# Patient Record
Sex: Female | Born: 1985 | Race: Black or African American | Hispanic: No | Marital: Married | Smoking: Never smoker
Health system: Southern US, Community
[De-identification: ages and names within clinical notes are randomized; demographics above are authoritative.]

## PROBLEM LIST (undated history)

## (undated) ENCOUNTER — Emergency Department (HOSPITAL_COMMUNITY): Source: Home / Self Care

## (undated) ENCOUNTER — Emergency Department (HOSPITAL_COMMUNITY)

## (undated) ENCOUNTER — Inpatient Hospital Stay (HOSPITAL_COMMUNITY): Payer: Self-pay

## (undated) DIAGNOSIS — E669 Obesity, unspecified: Secondary | ICD-10-CM

## (undated) DIAGNOSIS — O139 Gestational [pregnancy-induced] hypertension without significant proteinuria, unspecified trimester: Secondary | ICD-10-CM

## (undated) HISTORY — PX: TONSILLECTOMY: SUR1361

---

## 2004-04-16 ENCOUNTER — Ambulatory Visit: Payer: Self-pay | Admitting: Nurse Practitioner

## 2006-10-12 ENCOUNTER — Emergency Department (HOSPITAL_COMMUNITY): Admission: EM | Admit: 2006-10-12 | Discharge: 2006-10-12 | Payer: Self-pay | Admitting: Emergency Medicine

## 2007-07-05 ENCOUNTER — Ambulatory Visit: Payer: Self-pay | Admitting: Internal Medicine

## 2007-11-05 ENCOUNTER — Emergency Department (HOSPITAL_COMMUNITY): Admission: EM | Admit: 2007-11-05 | Discharge: 2007-11-06 | Payer: Self-pay | Admitting: Emergency Medicine

## 2008-02-27 ENCOUNTER — Emergency Department (HOSPITAL_COMMUNITY): Admission: EM | Admit: 2008-02-27 | Discharge: 2008-02-27 | Payer: Self-pay | Admitting: Emergency Medicine

## 2008-03-20 ENCOUNTER — Emergency Department (HOSPITAL_COMMUNITY): Admission: EM | Admit: 2008-03-20 | Discharge: 2008-03-20 | Payer: Self-pay | Admitting: Emergency Medicine

## 2008-08-18 ENCOUNTER — Emergency Department (HOSPITAL_COMMUNITY): Admission: EM | Admit: 2008-08-18 | Discharge: 2008-08-18 | Payer: Self-pay | Admitting: Emergency Medicine

## 2010-05-16 LAB — COMPREHENSIVE METABOLIC PANEL
BUN: 10 mg/dL (ref 6–23)
Calcium: 9.3 mg/dL (ref 8.4–10.5)
Creatinine, Ser: 0.81 mg/dL (ref 0.4–1.2)
Glucose, Bld: 111 mg/dL — ABNORMAL HIGH (ref 70–99)
Sodium: 140 mEq/L (ref 135–145)
Total Protein: 7.1 g/dL (ref 6.0–8.3)

## 2010-05-16 LAB — URINALYSIS, ROUTINE W REFLEX MICROSCOPIC
Glucose, UA: NEGATIVE mg/dL
Protein, ur: 30 mg/dL — AB

## 2010-05-16 LAB — CBC
HCT: 35.2 % — ABNORMAL LOW (ref 36.0–46.0)
Hemoglobin: 11.8 g/dL — ABNORMAL LOW (ref 12.0–15.0)
MCHC: 33.6 g/dL (ref 30.0–36.0)
RDW: 14.6 % (ref 11.5–15.5)

## 2010-05-16 LAB — URINE MICROSCOPIC-ADD ON

## 2010-05-16 LAB — LIPASE, BLOOD: Lipase: 27 U/L (ref 11–59)

## 2010-05-24 LAB — URINE CULTURE: Colony Count: >=100000

## 2010-05-24 LAB — URINALYSIS, ROUTINE W REFLEX MICROSCOPIC
Nitrite: NEGATIVE
Specific Gravity, Urine: 1.032 — ABNORMAL HIGH (ref 1.005–1.030)
Urobilinogen, UA: 0.2 mg/dL (ref 0.0–1.0)

## 2010-05-24 LAB — URINE MICROSCOPIC-ADD ON

## 2010-05-24 LAB — POCT PREGNANCY, URINE: Preg Test, Ur: NEGATIVE

## 2010-05-25 LAB — COMPREHENSIVE METABOLIC PANEL
ALT: 17 U/L (ref 0–35)
AST: 17 U/L (ref 0–37)
Alkaline Phosphatase: 80 U/L (ref 39–117)
CO2: 26 mEq/L (ref 19–32)
GFR calc Af Amer: >60 mL/min (ref >60)
Glucose, Bld: 99 mg/dL (ref 70–99)
Potassium: 4.3 mEq/L (ref 3.5–5.1)
Sodium: 136 mEq/L (ref 135–145)
Total Protein: 7.1 g/dL (ref 6.0–8.3)

## 2010-05-25 LAB — URINE MICROSCOPIC-ADD ON

## 2010-05-25 LAB — URINALYSIS, ROUTINE W REFLEX MICROSCOPIC
Bilirubin Urine: NEGATIVE
Glucose, UA: NEGATIVE mg/dL
Hgb urine dipstick: NEGATIVE
Specific Gravity, Urine: 1.028 (ref 1.005–1.030)
Urobilinogen, UA: 0.2 mg/dL (ref 0.0–1.0)
pH: 6 (ref 5.0–8.0)

## 2010-05-25 LAB — CBC
Hemoglobin: 12.1 g/dL (ref 12.0–15.0)
RBC: 4.15 MIL/uL (ref 3.87–5.11)
RDW: 14.9 % (ref 11.5–15.5)

## 2010-05-25 LAB — HCG, QUANTITATIVE, PREGNANCY: hCG, Beta Chain, Quant, S: <2 m[IU]/mL (ref <5)

## 2010-11-08 LAB — URINE MICROSCOPIC-ADD ON

## 2010-11-08 LAB — URINALYSIS, ROUTINE W REFLEX MICROSCOPIC
Glucose, UA: NEGATIVE
Hgb urine dipstick: NEGATIVE
Protein, ur: 30 — AB
Specific Gravity, Urine: 1.038 — ABNORMAL HIGH
Urobilinogen, UA: 1

## 2010-11-08 LAB — DIFFERENTIAL
Basophils Absolute: 0
Basophils Relative: 0
Eosinophils Relative: 0
Monocytes Absolute: 0.6
Monocytes Relative: 5
Neutro Abs: 9.5 — ABNORMAL HIGH

## 2010-11-08 LAB — COMPREHENSIVE METABOLIC PANEL
AST: 17
Albumin: 3.7
Alkaline Phosphatase: 69
BUN: 11
Chloride: 107
GFR calc Af Amer: >60
Potassium: 3.9
Sodium: 138
Total Bilirubin: 0.8
Total Protein: 7.5

## 2010-11-08 LAB — CBC
Platelets: 300
RDW: 14.7
WBC: 11.6 — ABNORMAL HIGH

## 2010-11-08 LAB — POCT PREGNANCY, URINE: Preg Test, Ur: NEGATIVE

## 2010-11-08 LAB — GC/CHLAMYDIA PROBE AMP, GENITAL: Chlamydia, DNA Probe: POSITIVE — AB

## 2011-04-08 ENCOUNTER — Other Ambulatory Visit: Payer: Self-pay

## 2011-04-11 ENCOUNTER — Other Ambulatory Visit (HOSPITAL_COMMUNITY): Payer: Self-pay | Admitting: Obstetrics & Gynecology

## 2011-04-11 DIAGNOSIS — Z3682 Encounter for antenatal screening for nuchal translucency: Secondary | ICD-10-CM

## 2011-04-26 ENCOUNTER — Ambulatory Visit (HOSPITAL_COMMUNITY)
Admission: RE | Admit: 2011-04-26 | Discharge: 2011-04-26 | Disposition: A | Discharge status: Home / Self Care | Payer: Medicaid Other | Source: Ambulatory Visit | Attending: Obstetrics & Gynecology | Admitting: Obstetrics & Gynecology

## 2011-04-26 ENCOUNTER — Other Ambulatory Visit (HOSPITAL_COMMUNITY): Payer: Self-pay | Admitting: Obstetrics & Gynecology

## 2011-04-26 ENCOUNTER — Ambulatory Visit (HOSPITAL_COMMUNITY): Admission: RE | Admit: 2011-04-26 | Payer: Medicaid Other | Source: Ambulatory Visit

## 2011-04-26 DIAGNOSIS — Z3682 Encounter for antenatal screening for nuchal translucency: Secondary | ICD-10-CM

## 2011-04-26 DIAGNOSIS — O3510X0 Maternal care for (suspected) chromosomal abnormality in fetus, unspecified, not applicable or unspecified: Secondary | ICD-10-CM | POA: Insufficient documentation

## 2011-04-26 DIAGNOSIS — O351XX0 Maternal care for (suspected) chromosomal abnormality in fetus, not applicable or unspecified: Secondary | ICD-10-CM | POA: Insufficient documentation

## 2011-04-26 DIAGNOSIS — E669 Obesity, unspecified: Secondary | ICD-10-CM | POA: Insufficient documentation

## 2011-04-26 DIAGNOSIS — Z3689 Encounter for other specified antenatal screening: Secondary | ICD-10-CM | POA: Insufficient documentation

## 2011-04-26 DIAGNOSIS — O09299 Supervision of pregnancy with other poor reproductive or obstetric history, unspecified trimester: Secondary | ICD-10-CM | POA: Insufficient documentation

## 2011-04-26 DIAGNOSIS — O30009 Twin pregnancy, unspecified number of placenta and unspecified number of amniotic sacs, unspecified trimester: Secondary | ICD-10-CM | POA: Insufficient documentation

## 2011-04-26 DIAGNOSIS — IMO0001 Reserved for inherently not codable concepts without codable children: Secondary | ICD-10-CM

## 2011-04-26 NOTE — Progress Notes (Signed)
Genetic Counseling  High-Risk Gestation Note  Appointment Date:  04/26/2011 Referred By: Annamaria Helling, MD Date of Birth:  08-24-1985 Attending: Particia Nearing, MD   [name] and her partner, Glenda Romero, were seen for genetic counseling because of an increased nuchal translucency measurement for Twin A on ultrasound today.   Ultrasound performed today visualized an increased nuchal translucency measurement for Twin A. Complete ultrasound results reported separately.   We discussed that the fetal NT refers to a fluid filled space between the skin and soft tissues behind the cervical spine. Along this same continuum, a cystic hygroma describes a fluid filled sac, often septated, at the back of the neck that typically results from failure of the fetal lymphatic system. This space is traditionally measurable between 11 and 13.[redacted] weeks gestation and is considered enlarged at ~3 mm or greater.  This couple was counseled regarding the various common etiologies for an enlarged NT/cystic hygroma including: aneuploidy, single gene conditions, cardiac or great vessel abnormalities, lymphatic system failure, decreased fetal movement, and fetal anemia.    We reviewed chromosomes, nondisjunction, and the common features and prognoses of Down syndrome, trisomy 75, trisomy 55, and Turner syndrome. We discussed that an NT measurement between 3.5 and 4.4 mm is associated with an approximate 21% risk for fetal aneuploidy. A cystic hygroma is associated with an approximate 50-60% risk for fetal aneuploidy.  In addition, we discussed that an NT in this range is associated with an approximate 3% risk for a fetal cardiac anomaly.  We also discussed single gene conditions and that these conditions are not routinely tested for prenatally unless ultrasound findings or family history significantly increase the suspicion of a specific single gene disorder.    They were then counseled regarding their options screening options 2nd  trimester detailed ultrasound and fetal echocardiogram. We discussed another type of screening test, noninvasive prenatal testing (NIPT), which utilizes cell free fetal DNA found in the maternal circulation. This test is not diagnostic for chromosome conditions, but can provide information regarding the presence or absence of extra fetal DNA for chromosomes 13, 18 and 21. There are several laboratories performing this testing. We discussed that MaterniT21 also assesses for fetal X chromosome aneuploidy, though detection rate for these conditions are not currently reported by the lab. We discussed that cell free fetal DNA testing would not identify or rule out all fetal aneuploidy. The reported detection rate is greater than 99% for Trisomy 21, greater than 97% for Trisomy 18, and is approximately 80% (8 out of 10) for Trisomy 13. The false positive rate is reported to be less than 1% for any of these conditions. We also discussed the diagnostic options of CVS and amniocentesis. A risk of 1 in 100 was given for CVS and 1 in 200-300 was given for amniocentesis for complications, with the primary concern being spontaneous pregnancy loss. We reviewed the benefits, limitations, and risks of these options.  After thoughtful consideration, they declined all screening and diagnostic options at the time of today's visit. Glenda Romero expressed interest in pursuing additional testing for information and preparation purposes. However, she was unsure at the time of today's visit which testing option may be best for her and expressed that she wanted to further discuss these testing options with her OB at her next OB visit on Thursday.  They were counseled that the fetal prognosis depends on the underlying etiology of the enlarged NT/cystic hygroma, but that there are indications by ultrasound, such as hydrops, which significantly  increase the likelihood of a poor prognosis.  They understand that increased nuchal translucency  measurements/cystic hygromas may worsen and progress to hydrops fetalis, improve and regress, or remain unchanged at follow up ultrasounds.  Both family histories were reviewed and found to be noncontributory for birth defects, mental retardation, recurrent pregnancy loss, and known genetic conditions. Consanguinity was denied. Without further information regarding the provided family history, an accurate genetic risk cannot be calculated. Further genetic counseling is warranted if more information is obtained.  Glenda Romero denied exposure to environmental toxins or chemical agents. She denied the use of alcohol, tobacco or street drugs. She denied significant viral illnesses during the course of her pregnancy. Her medical and surgical histories were noncontributory.   I counseled this couple for approximately 35 minutes regarding the above risks and available options.     Quinn Plowman, MS,  Certified Genetic Counselor 04/26/2011

## 2011-04-28 ENCOUNTER — Other Ambulatory Visit (HOSPITAL_COMMUNITY): Payer: Self-pay | Admitting: Obstetrics & Gynecology

## 2011-04-28 DIAGNOSIS — D181 Lymphangioma, any site: Secondary | ICD-10-CM

## 2011-04-28 DIAGNOSIS — IMO0001 Reserved for inherently not codable concepts without codable children: Secondary | ICD-10-CM

## 2011-05-13 ENCOUNTER — Encounter (HOSPITAL_COMMUNITY): Payer: Self-pay

## 2011-05-13 ENCOUNTER — Other Ambulatory Visit (HOSPITAL_COMMUNITY): Payer: Medicaid Other

## 2011-05-13 ENCOUNTER — Ambulatory Visit (HOSPITAL_COMMUNITY): Admission: RE | Admit: 2011-05-13 | Payer: Medicaid Other | Source: Ambulatory Visit

## 2011-05-17 ENCOUNTER — Other Ambulatory Visit (HOSPITAL_COMMUNITY): Payer: Self-pay | Admitting: Obstetrics & Gynecology

## 2011-05-17 DIAGNOSIS — IMO0002 Reserved for concepts with insufficient information to code with codable children: Secondary | ICD-10-CM

## 2011-05-19 ENCOUNTER — Other Ambulatory Visit (HOSPITAL_COMMUNITY): Payer: Self-pay | Admitting: Obstetrics & Gynecology

## 2011-05-19 ENCOUNTER — Ambulatory Visit (HOSPITAL_COMMUNITY)
Admission: RE | Admit: 2011-05-19 | Discharge: 2011-05-19 | Disposition: A | Discharge status: Home / Self Care | Payer: Medicaid Other | Source: Ambulatory Visit | Attending: Obstetrics & Gynecology | Admitting: Obstetrics & Gynecology

## 2011-05-19 ENCOUNTER — Encounter (HOSPITAL_COMMUNITY): Payer: Self-pay

## 2011-05-19 ENCOUNTER — Other Ambulatory Visit: Payer: Self-pay

## 2011-05-19 DIAGNOSIS — O30009 Twin pregnancy, unspecified number of placenta and unspecified number of amniotic sacs, unspecified trimester: Secondary | ICD-10-CM | POA: Insufficient documentation

## 2011-05-19 DIAGNOSIS — IMO0001 Reserved for inherently not codable concepts without codable children: Secondary | ICD-10-CM

## 2011-05-19 DIAGNOSIS — Z0489 Encounter for examination and observation for other specified reasons: Secondary | ICD-10-CM

## 2011-05-19 DIAGNOSIS — IMO0002 Reserved for concepts with insufficient information to code with codable children: Secondary | ICD-10-CM

## 2011-05-19 DIAGNOSIS — O358XX0 Maternal care for other (suspected) fetal abnormality and damage, not applicable or unspecified: Secondary | ICD-10-CM | POA: Insufficient documentation

## 2011-05-19 DIAGNOSIS — E669 Obesity, unspecified: Secondary | ICD-10-CM | POA: Insufficient documentation

## 2011-05-19 DIAGNOSIS — O09299 Supervision of pregnancy with other poor reproductive or obstetric history, unspecified trimester: Secondary | ICD-10-CM | POA: Insufficient documentation

## 2011-05-19 NOTE — Progress Notes (Signed)
Glenda Romero was seen for ultrasound appointment today.  Please see [facility] report for details.

## 2011-06-03 ENCOUNTER — Ambulatory Visit (HOSPITAL_COMMUNITY): Admission: RE | Admit: 2011-06-03 | Payer: Medicaid Other | Source: Ambulatory Visit

## 2011-06-03 ENCOUNTER — Other Ambulatory Visit (HOSPITAL_COMMUNITY): Payer: Self-pay | Admitting: Obstetrics & Gynecology

## 2011-06-03 DIAGNOSIS — Z0489 Encounter for examination and observation for other specified reasons: Secondary | ICD-10-CM

## 2011-06-07 ENCOUNTER — Telehealth (HOSPITAL_COMMUNITY): Payer: Self-pay | Admitting: MS"

## 2011-06-07 NOTE — Telephone Encounter (Signed)
Called [facility] to discuss her MaterniT21, cell free fetal DNA testing. A result was not able to be obtained given insufficient quantity of fetal DNA. We discussed the option to redrawn blood sample to pursue MaterniT21 again vs. Amniocentesis vs. No additional testing. The patient stated that she did not want to redraw MaterniT21 at this time and she declined amniocentesis. We reviewed that these testing options are not timing specific and she may contact us should she elect to pursue testing at a later date. Per discussion with [employer], the likelihood of obtaining a result from a redraw sample has been observed to be increased after [redacted] weeks gestation. We also reviewed this with the patient. Ms. Biever stated that she was unaware of her anatomy ultrasound appointment last Friday and needs to reschedule. She plans to call the main number to our office to reschedule the appointment.    Quinn Plowman, MS Patent attorney

## 2011-06-13 ENCOUNTER — Other Ambulatory Visit (HOSPITAL_COMMUNITY): Payer: Self-pay | Admitting: Obstetrics & Gynecology

## 2011-06-13 DIAGNOSIS — O358XX0 Maternal care for other (suspected) fetal abnormality and damage, not applicable or unspecified: Secondary | ICD-10-CM

## 2011-06-13 DIAGNOSIS — O09299 Supervision of pregnancy with other poor reproductive or obstetric history, unspecified trimester: Secondary | ICD-10-CM

## 2011-06-13 DIAGNOSIS — Z3689 Encounter for other specified antenatal screening: Secondary | ICD-10-CM

## 2011-06-13 DIAGNOSIS — O30009 Twin pregnancy, unspecified number of placenta and unspecified number of amniotic sacs, unspecified trimester: Secondary | ICD-10-CM

## 2011-06-14 ENCOUNTER — Ambulatory Visit (HOSPITAL_COMMUNITY): Payer: Medicaid Other

## 2011-06-19 ENCOUNTER — Emergency Department (HOSPITAL_COMMUNITY)
Admission: EM | Admit: 2011-06-19 | Discharge: 2011-06-19 | Disposition: A | Discharge status: Home / Self Care | Payer: Medicaid Other | Attending: Emergency Medicine | Admitting: Emergency Medicine

## 2011-06-19 ENCOUNTER — Encounter (HOSPITAL_COMMUNITY): Payer: Self-pay | Admitting: Emergency Medicine

## 2011-06-19 DIAGNOSIS — R109 Unspecified abdominal pain: Secondary | ICD-10-CM

## 2011-06-19 DIAGNOSIS — N390 Urinary tract infection, site not specified: Secondary | ICD-10-CM | POA: Insufficient documentation

## 2011-06-19 DIAGNOSIS — O2390 Unspecified genitourinary tract infection in pregnancy, unspecified trimester: Secondary | ICD-10-CM | POA: Insufficient documentation

## 2011-06-19 DIAGNOSIS — O2342 Unspecified infection of urinary tract in pregnancy, second trimester: Secondary | ICD-10-CM

## 2011-06-19 LAB — BASIC METABOLIC PANEL
BUN: 9 mg/dL (ref 6–23)
Calcium: 9 mg/dL (ref 8.4–10.5)
Creatinine, Ser: 0.57 mg/dL (ref 0.50–1.10)
GFR calc non Af Amer: >90 mL/min (ref >90)
Glucose, Bld: 75 mg/dL (ref 70–99)
Sodium: 137 mEq/L (ref 135–145)

## 2011-06-19 LAB — URINE MICROSCOPIC-ADD ON

## 2011-06-19 LAB — CBC
MCH: 30.1 pg (ref 26.0–34.0)
MCV: 88.8 fL (ref 78.0–100.0)
Platelets: 274 10*3/uL (ref 150–400)
RBC: 3.65 MIL/uL — ABNORMAL LOW (ref 3.87–5.11)

## 2011-06-19 LAB — DIFFERENTIAL
Eosinophils Absolute: 0.2 10*3/uL (ref 0.0–0.7)
Eosinophils Relative: 1 % (ref 0–5)
Lymphs Abs: 4.4 10*3/uL — ABNORMAL HIGH (ref 0.7–4.0)
Monocytes Absolute: 0.7 10*3/uL (ref 0.1–1.0)
Monocytes Relative: 5 % (ref 3–12)

## 2011-06-19 LAB — URINALYSIS, ROUTINE W REFLEX MICROSCOPIC
Bilirubin Urine: NEGATIVE
Ketones, ur: 15 mg/dL — AB
Protein, ur: >300 mg/dL — AB
Urobilinogen, UA: 1 mg/dL (ref 0.0–1.0)

## 2011-06-19 MED ORDER — NITROFURANTOIN MONOHYD MACRO 100 MG PO CAPS: 100.0000 mg | ORAL_CAPSULE | Freq: Two times a day (BID) | ORAL | Status: AC

## 2011-06-19 MED ORDER — METHYLPREDNISOLONE SODIUM SUCC 125 MG IJ SOLR
125.0000 mg | Freq: Once | INTRAMUSCULAR | Status: DC
Start: 2011-06-19 — End: 2011-06-19

## 2011-06-19 MED ORDER — NITROFURANTOIN MONOHYD MACRO 100 MG PO CAPS
100.0000 mg | ORAL_CAPSULE | Freq: Once | ORAL | Status: AC
  Administered 2011-06-19: 100 mg via ORAL
  Filled 2011-06-19: qty 1

## 2011-06-19 NOTE — ED Notes (Signed)
Pt c/o lower abdominal pain and back pain x 2 days.  Pt is [redacted] weeks pregnant with twins, normal pregnancy with no complications.  Feeling babies move now.  Denies n/v/d, SOB, CP.

## 2011-06-19 NOTE — ED Notes (Signed)
Rx x 1, pt voiced understanding to f/u with PCP. 

## 2011-06-19 NOTE — Discharge Instructions (Signed)
Please take antibiotics as prescribed.  Tylenol for pain, hot water bottle or heating pad to areas of pain.  Please contact your OB office on Monday to let them know you were seen in the ER.  Return to the ER for worsening pain, fever, vomiting, or other concerning symptoms.

## 2011-06-19 NOTE — ED Notes (Signed)
PT. REPORTS MID ABDOMINAL AND LOW BACK PAIN FOR 2 DAYS , DENIES INJURY OR FALL , NO NAUSEA , VOMITTING OR DIARRHEA , DENIES VAGINAL DISCHARGE OR FEVER.  PT. STATES SHE IS 5 MONTHS PREGNANT WITH REGULA PRENATAL CHECK UP G2P1 .

## 2011-06-21 LAB — URINE CULTURE: Culture  Setup Time: 201305121051

## 2011-06-21 NOTE — ED Provider Notes (Cosign Needed)
History     CSN: [id number]  Arrival date & time 06/19/11  0127   First MD Initiated Contact with Patient 06/19/11 0308      Chief Complaint  Patient presents with  . Abdominal Pain    (Consider location/radiation/quality/duration/timing/severity/associated sxs/prior treatment) HPI 26 yo female at 4 weeks g2p1 pregnant with twins presents to the ER with lower abdominal pain and back pain.  No fevers, no dysuria.  No trauma.  Pt has taken tylenol with some improvement in pain.  Pt having normal BM.  No vaginal d/c, bleeding.  Pt is receiving normal ob checkups.  No complications this or last pregnancy.  Pain is not like contractions.   History reviewed. No pertinent past medical history.  History reviewed. No pertinent past surgical history.  No family history on file.  History  Substance Use Topics  . Smoking status: Never Smoker   . Smokeless tobacco: Not on file  . Alcohol Use: No    OB History    Grav Para Term Preterm Abortions TAB SAB Ect Mult Living   3 1 1  0 0 0 0 0 0 1      Review of Systems  All other systems reviewed and are negative.    Allergies  Review of patient's allergies indicates no known allergies.  Home Medications   Current Outpatient Rx  Name Route Sig Dispense Refill  . PRENATAL VITAMINS PO Oral Take 1 tablet by mouth daily.     Marland Kitchen NITROFURANTOIN MONOHYD MACRO 100 MG PO CAPS Oral Take 1 capsule (100 mg total) by mouth 2 (two) times daily. 14 capsule 0    BP 113/74  Pulse 81  Temp(Src) 97.8 F (36.6 C) (Oral)  Resp 18  SpO2 99%  Physical Exam  Nursing note and vitals reviewed. Constitutional: She is oriented to person, place, and time. She appears well-developed and well-nourished.       obese  HENT:  Head: Normocephalic and atraumatic.  Nose: Nose normal.  Mouth/Throat: Oropharynx is clear and moist.  Eyes: Conjunctivae and EOM are normal. Pupils are equal, round, and reactive to light.  Neck: Normal range of motion. Neck  supple. No JVD present. No tracheal deviation present. No thyromegaly present.  Cardiovascular: Normal rate, regular rhythm, normal heart sounds and intact distal pulses.  Exam reveals no gallop and no friction rub.   No murmur heard. Pulmonary/Chest: Effort normal and breath sounds normal. No stridor. No respiratory distress. She has no wheezes. She has no rales. She exhibits no tenderness.  Abdominal: Soft. Bowel sounds are normal. She exhibits mass (gravid uterus to umbilicus). She exhibits no distension. There is tenderness (mild tenderness across lower abd without rebound, guarding). There is no rebound and no guarding.  Musculoskeletal: Normal range of motion. She exhibits no edema and no tenderness.       Tenderness across lumbar paraspinal muscles bilaterally  Lymphadenopathy:    She has no cervical adenopathy.  Neurological: She is oriented to person, place, and time. She exhibits normal muscle tone. Coordination normal.  Skin: Skin is dry. No rash noted. No erythema. No pallor.  Psychiatric: She has a normal mood and affect. Her behavior is normal. Judgment and thought content normal.    ED Course  Procedures (including critical care time)  Labs Reviewed  CBC - Abnormal; Notable for the following:    WBC 13.0 (*)    RBC 3.65 (*)    Hemoglobin 11.0 (*)    HCT 32.4 (*)    All  other components within normal limits  DIFFERENTIAL - Abnormal; Notable for the following:    Lymphs Abs 4.4 (*)    All other components within normal limits  URINALYSIS, ROUTINE W REFLEX MICROSCOPIC - Abnormal; Notable for the following:    Color, Urine AMBER (*) BIOCHEMICALS MAY BE AFFECTED BY COLOR   APPearance CLOUDY (*)    Specific Gravity, Urine 1.039 (*)    Hgb urine dipstick MODERATE (*)    Ketones, ur 15 (*)    Protein, ur >300 (*)    Leukocytes, UA TRACE (*)    All other components within normal limits  URINE MICROSCOPIC-ADD ON - Abnormal; Notable for the following:    Squamous Epithelial /  LPF FEW (*)    Bacteria, UA FEW (*)    Casts HYALINE CASTS (*)    All other components within normal limits  BASIC METABOLIC PANEL  URINE CULTURE   No results found. Bedside u/s completed, both fetus with good movement, heart rate of right side twin 148, left side twin 140.  1. Abdominal pain   2. Urinary tract infection during pregnancy, second trimester       MDM  26 yo female with lower abd and back pain, possibly due to progressing pregnancy.  Will treat with macrobid for possible uti.  PT to f/u with ob.        Olivia Mackie, MD 06/21/11 [phone]

## 2011-06-22 ENCOUNTER — Ambulatory Visit (HOSPITAL_COMMUNITY)
Admission: RE | Admit: 2011-06-22 | Discharge: 2011-06-22 | Disposition: A | Discharge status: Home / Self Care | Payer: Medicaid Other | Source: Ambulatory Visit | Attending: Obstetrics & Gynecology | Admitting: Obstetrics & Gynecology

## 2011-06-22 VITALS — BP 127/72 | HR 86 | Wt 290.0 lb

## 2011-06-22 DIAGNOSIS — E669 Obesity, unspecified: Secondary | ICD-10-CM | POA: Insufficient documentation

## 2011-06-22 DIAGNOSIS — Z3689 Encounter for other specified antenatal screening: Secondary | ICD-10-CM

## 2011-06-22 DIAGNOSIS — O09299 Supervision of pregnancy with other poor reproductive or obstetric history, unspecified trimester: Secondary | ICD-10-CM | POA: Insufficient documentation

## 2011-06-22 DIAGNOSIS — O99210 Obesity complicating pregnancy, unspecified trimester: Secondary | ICD-10-CM | POA: Insufficient documentation

## 2011-06-22 DIAGNOSIS — O358XX0 Maternal care for other (suspected) fetal abnormality and damage, not applicable or unspecified: Secondary | ICD-10-CM | POA: Insufficient documentation

## 2011-06-22 DIAGNOSIS — O30009 Twin pregnancy, unspecified number of placenta and unspecified number of amniotic sacs, unspecified trimester: Secondary | ICD-10-CM | POA: Insufficient documentation

## 2011-07-21 ENCOUNTER — Ambulatory Visit (HOSPITAL_COMMUNITY): Admission: RE | Admit: 2011-07-21 | Payer: Medicaid Other | Source: Ambulatory Visit

## 2012-10-26 ENCOUNTER — Emergency Department (HOSPITAL_COMMUNITY)
Admission: EM | Admit: 2012-10-26 | Discharge: 2012-10-26 | Disposition: A | Discharge status: Home / Self Care | Payer: Medicaid Other | Attending: Emergency Medicine | Admitting: Emergency Medicine

## 2012-10-26 ENCOUNTER — Encounter (HOSPITAL_COMMUNITY): Payer: Self-pay | Admitting: Emergency Medicine

## 2012-10-26 ENCOUNTER — Emergency Department (HOSPITAL_COMMUNITY): Payer: Medicaid Other

## 2012-10-26 DIAGNOSIS — E669 Obesity, unspecified: Secondary | ICD-10-CM | POA: Insufficient documentation

## 2012-10-26 DIAGNOSIS — W219XXA Striking against or struck by unspecified sports equipment, initial encounter: Secondary | ICD-10-CM | POA: Insufficient documentation

## 2012-10-26 DIAGNOSIS — M25562 Pain in left knee: Secondary | ICD-10-CM

## 2012-10-26 DIAGNOSIS — Y9364 Activity, baseball: Secondary | ICD-10-CM | POA: Insufficient documentation

## 2012-10-26 DIAGNOSIS — Y9239 Other specified sports and athletic area as the place of occurrence of the external cause: Secondary | ICD-10-CM | POA: Insufficient documentation

## 2012-10-26 DIAGNOSIS — S8990XA Unspecified injury of unspecified lower leg, initial encounter: Secondary | ICD-10-CM | POA: Insufficient documentation

## 2012-10-26 HISTORY — DX: Obesity, unspecified: E66.9

## 2012-10-26 MED ORDER — IBUPROFEN 800 MG PO TABS: 800.0000 mg | ORAL_TABLET | Freq: Three times a day (TID) | ORAL | Status: DC | PRN

## 2012-10-26 NOTE — ED Provider Notes (Signed)
CSN: [id number]     Arrival date & time 10/26/12  2114 History  This chart was scribed for Trixie Dredge, PA working with Raelyn Number, DO by Quintella Reichert, ED Scribe. This patient was seen in room TR07C/TR07C and the patient's care was started at 10:44 PM.  Chief Complaint  Patient presents with  . Knee Injury    The history is provided by the patient. No language interpreter was used.    HPI Comments: Glenda Romero is a 27 y.o. female who presents to the Emergency Department complaining of a left knee injury sustained earlier today.  Pt states she was playing baseball with her daughter and some other children when one of the children accidentally swung the bat into her knee.  Since then she has had constant moderate diffuse pain to the entire front of her knee.  Pain is exacerbated by flexing the knee.  She has attempted to treat pain with Tylenol, without relief.  She denies weakness, numbness or tingling.  She denies injury or pain to any other area including head, back, neck or arms.    Past Medical History  Diagnosis Date  . Obesity     Past Surgical History  Procedure Laterality Date  . Tonsillectomy      No family history on file.   History  Substance Use Topics  . Smoking status: Never Smoker   . Smokeless tobacco: Not on file  . Alcohol Use: No    OB History   Grav Para Term Preterm Abortions TAB SAB Ect Mult Living   3 1 1  0 0 0 0 0 0 1      Review of Systems  Musculoskeletal: Positive for arthralgias. Negative for back pain.  Neurological: Negative for weakness and numbness.     Allergies  Review of patient's allergies indicates no known allergies.  Home Medications  No current outpatient prescriptions on file.  BP 140/97  Pulse 89  Temp(Src) 98.6 F (37 C) (Oral)  Resp 14  SpO2 97%  LMP 09/17/2012  Breastfeeding? Unknown  Physical Exam  Nursing note and vitals reviewed. Constitutional: She appears well-developed and well-nourished. No  distress.  HENT:  Head: Normocephalic and atraumatic.  Neck: Neck supple.  Cardiovascular: Normal rate.   Pulmonary/Chest: Effort normal.  Musculoskeletal: She exhibits no edema.       Left knee: Tenderness found.  Diffuse tenderness to left knee anteriorly.  No erythema, edema or warmth.  No laxity with anterior or posterior valgus or varus stress.  Distal pulses intact. No calf tenderness or edema.  Neurological: She is alert.  Skin: She is not diaphoretic.    ED Course  Procedures (including critical care time)  DIAGNOSTIC STUDIES: Oxygen Saturation is 97% on room air, normal by my interpretation.    COORDINATION OF CARE: 10:55 PM-Discussed treatment plan which includes symptomatic relief with pt at bedside and pt agreed to plan.    Labs Review Labs Reviewed - No data to display  Imaging Review Dg Knee Complete 4 Views Left  10/26/2012   CLINICAL DATA:  Pain post trauma  EXAM: LEFT KNEE - COMPLETE 4+ VIEW  COMPARISON:  None.  FINDINGS: Frontal, lateral, and bilateral oblique views were obtained. There is no fracture, dislocation, or effusion. Joint spaces appear intact. There is minimal spurring medially and laterally. No erosive change.  IMPRESSION: Slight osteoarthritic change. No fracture or effusion.   Electronically Signed   By: Bretta Bang   On: 10/26/2012 22:01    MDM  1. Left anterior knee pain    Patient with diffuse tenderness with no skin changes over left knee after being hit with a bat accidentally.  Xray shows no fracture.  Likely mild contusion.  Ibuprofen and RICE at home.  Discussed result, findings, treatment, and follow up  with patient.  Pt given return precautions.  Pt verbalizes understanding and agrees with plan.        I personally performed the services described in this documentation, which was scribed in my presence. The recorded information has been reviewed and is accurate.   Trixie Dredge, PA-C 10/26/12 2310

## 2012-10-26 NOTE — ED Provider Notes (Signed)
Medical screening examination/treatment/procedure(s) were performed by non-physician practitioner and as supervising physician I was immediately available for consultation/collaboration.  Kristen N Ward, DO 10/26/12 2337 

## 2012-10-26 NOTE — ED Notes (Signed)
Pt. reports left knee injury / pain accidentally hit with a baseball bat today while playing with daughter . Ambulatory.

## 2013-05-16 IMAGING — US US OB COMP LESS 14 WK
1 series · 14 of 28 positions shown · non-contrast
Comparison: none

[Series 1: us ob comp less 14 wk · 0.07mm/px · 14 of 91 slices shown]
[im 4/91]
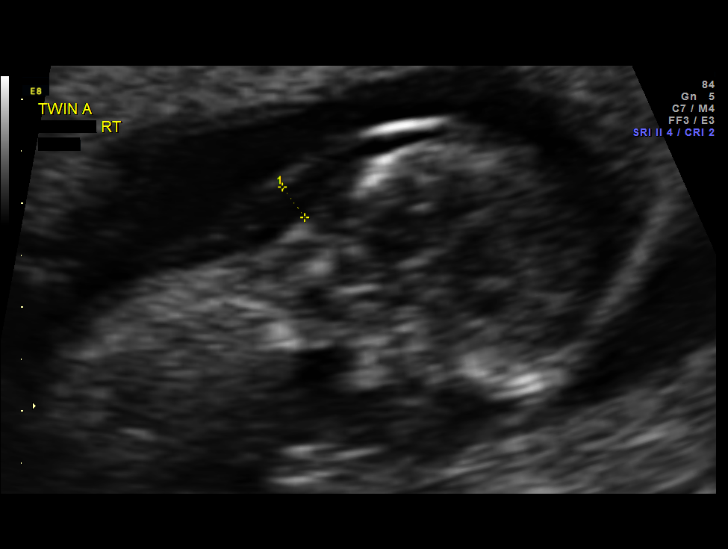
[im 11/91]
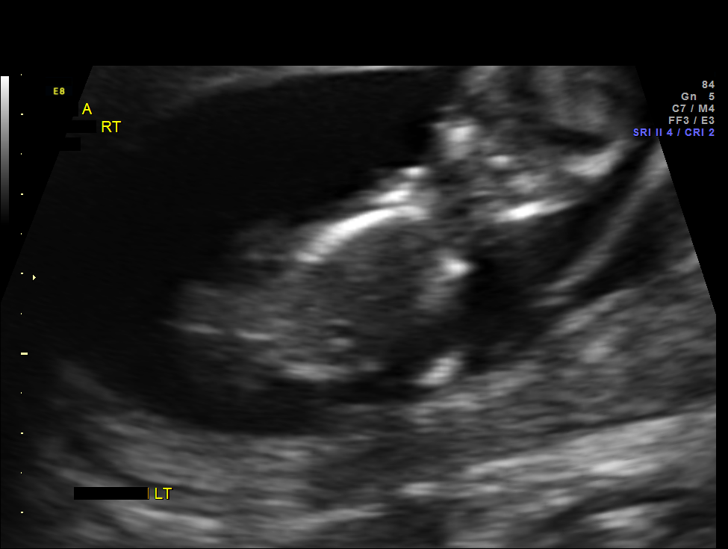
[im 17/91]
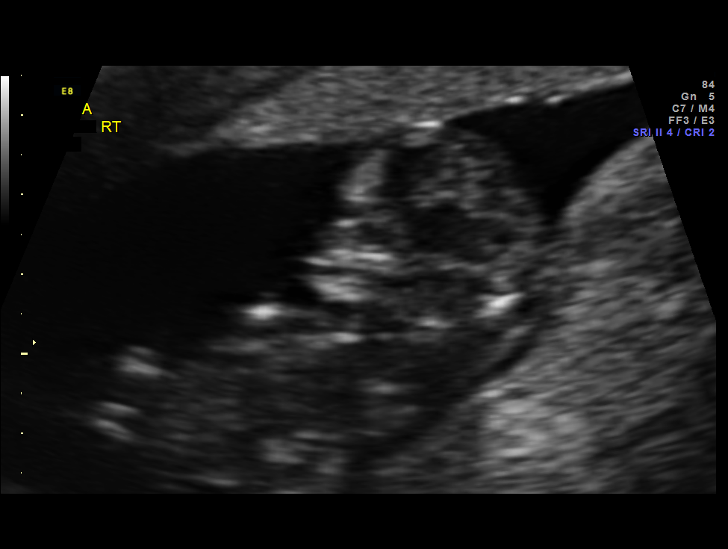
[im 24/91]
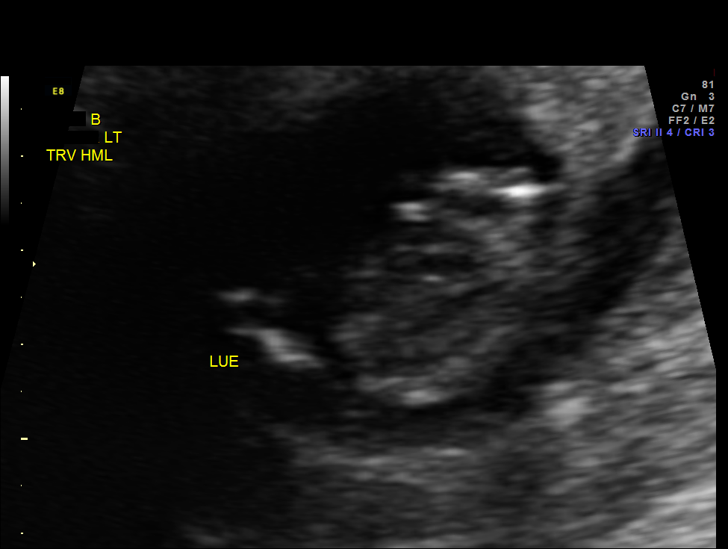
[im 31/91]
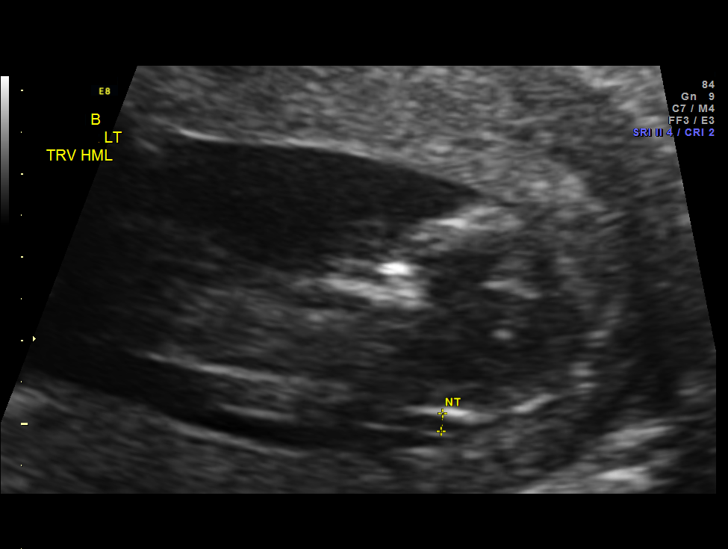
[im 37/91]
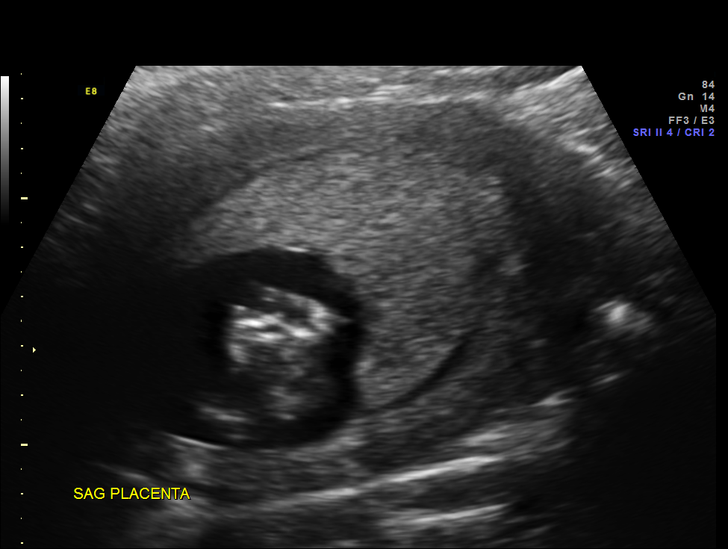
[im 44/91]
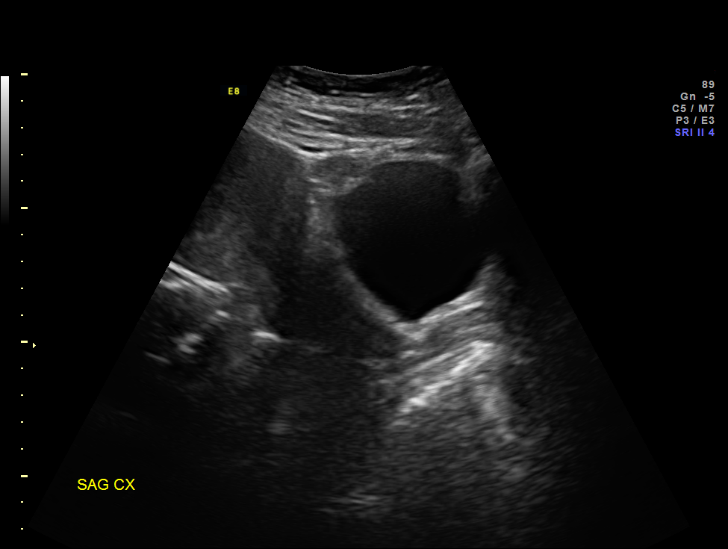
[im 51/91]
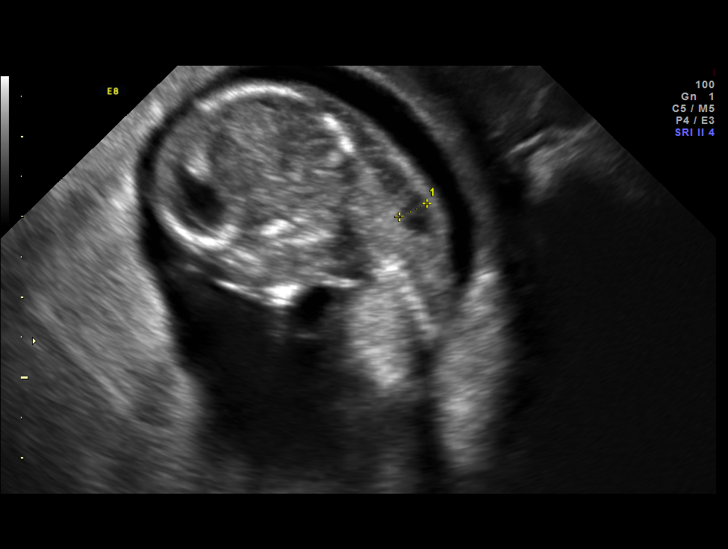
[im 57/91]
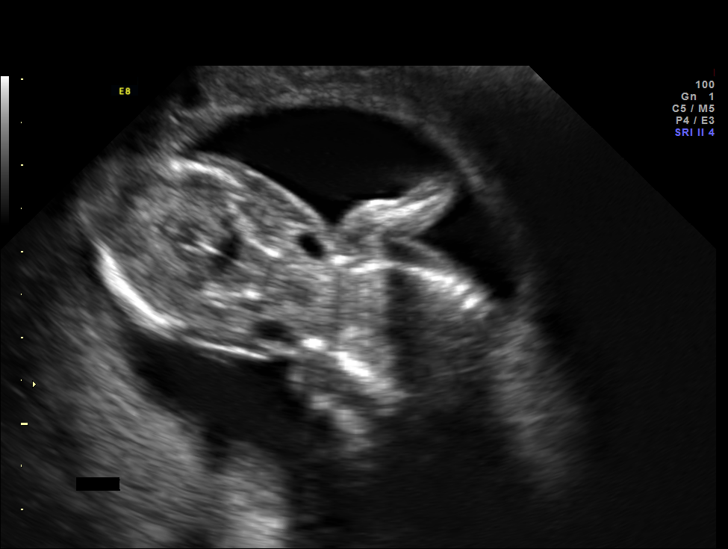
[im 64/91]
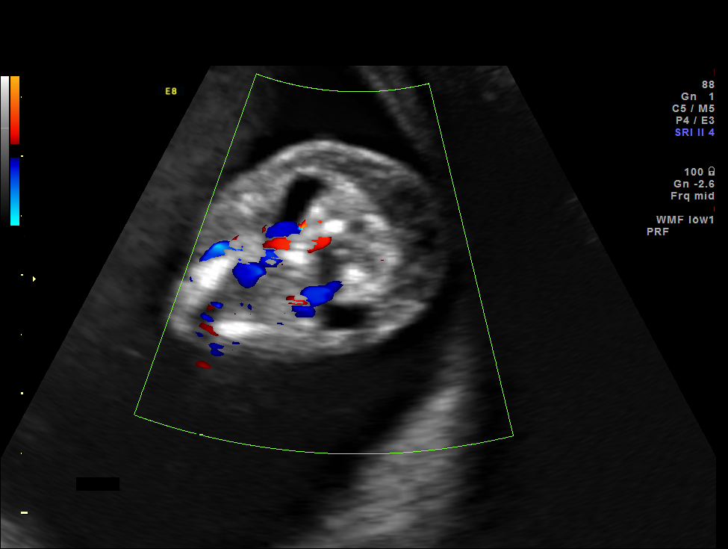
[im 71/91]
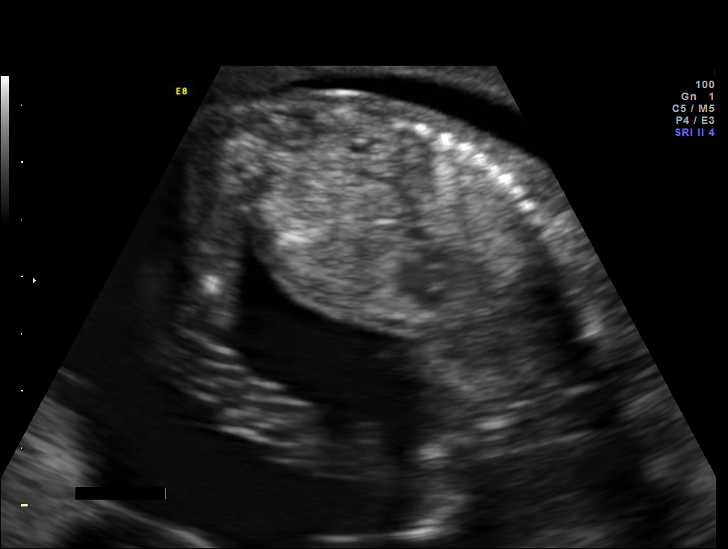
[im 77/91]
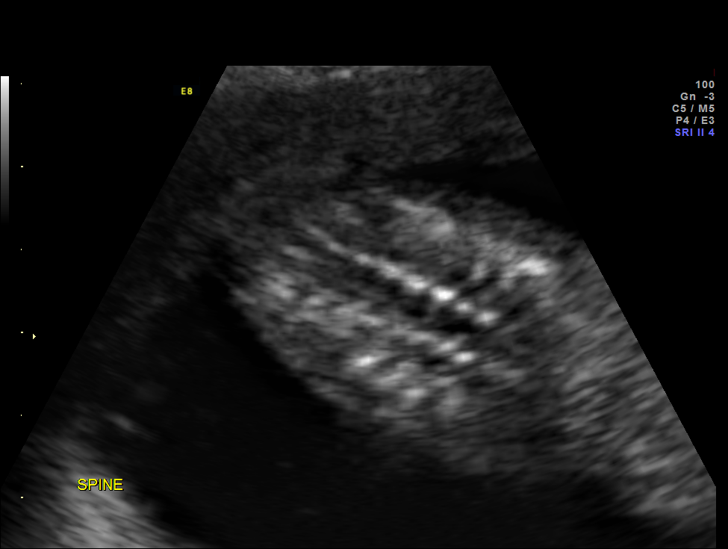
[im 84/91]
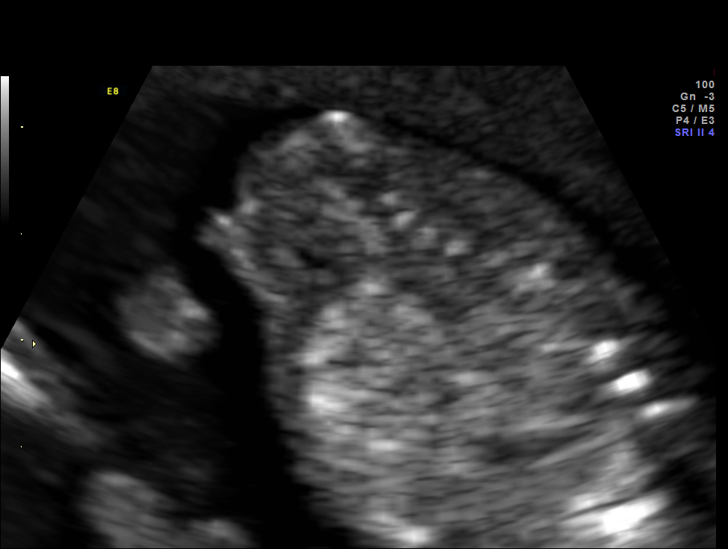
[im 91/91]
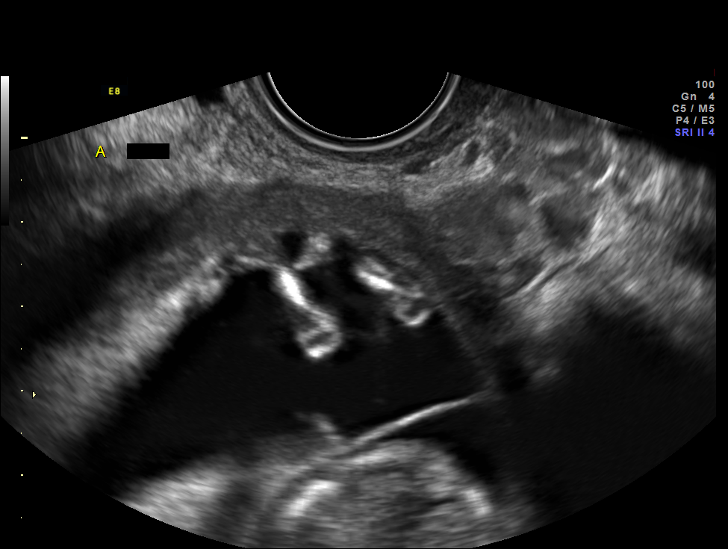

[14 of 28 positions shown; findings below may reference images not displayed]

Canned report from images found in remote index.

Refer to host system for actual result text.

## 2013-10-17 ENCOUNTER — Encounter (HOSPITAL_COMMUNITY): Payer: Self-pay | Admitting: Emergency Medicine

## 2013-10-17 ENCOUNTER — Emergency Department (HOSPITAL_COMMUNITY)
Admission: EM | Admit: 2013-10-17 | Discharge: 2013-10-17 | Disposition: A | Discharge status: Home / Self Care | Payer: Medicaid Other | Attending: Emergency Medicine | Admitting: Emergency Medicine

## 2013-10-17 DIAGNOSIS — S6990XA Unspecified injury of unspecified wrist, hand and finger(s), initial encounter: Secondary | ICD-10-CM

## 2013-10-17 DIAGNOSIS — Y990 Civilian activity done for income or pay: Secondary | ICD-10-CM | POA: Diagnosis not present

## 2013-10-17 DIAGNOSIS — Y9389 Activity, other specified: Secondary | ICD-10-CM | POA: Diagnosis not present

## 2013-10-17 DIAGNOSIS — G5600 Carpal tunnel syndrome, unspecified upper limb: Secondary | ICD-10-CM | POA: Diagnosis not present

## 2013-10-17 DIAGNOSIS — X500XXA Overexertion from strenuous movement or load, initial encounter: Secondary | ICD-10-CM | POA: Insufficient documentation

## 2013-10-17 DIAGNOSIS — S59909A Unspecified injury of unspecified elbow, initial encounter: Secondary | ICD-10-CM | POA: Diagnosis present

## 2013-10-17 DIAGNOSIS — E669 Obesity, unspecified: Secondary | ICD-10-CM | POA: Insufficient documentation

## 2013-10-17 DIAGNOSIS — G5601 Carpal tunnel syndrome, right upper limb: Secondary | ICD-10-CM

## 2013-10-17 DIAGNOSIS — S63509A Unspecified sprain of unspecified wrist, initial encounter: Secondary | ICD-10-CM | POA: Insufficient documentation

## 2013-10-17 DIAGNOSIS — S59919A Unspecified injury of unspecified forearm, initial encounter: Secondary | ICD-10-CM

## 2013-10-17 DIAGNOSIS — Y929 Unspecified place or not applicable: Secondary | ICD-10-CM | POA: Diagnosis not present

## 2013-10-17 DIAGNOSIS — S63501A Unspecified sprain of right wrist, initial encounter: Secondary | ICD-10-CM

## 2013-10-17 MED ORDER — NAPROXEN 500 MG PO TABS: 500.0000 mg | ORAL_TABLET | Freq: Two times a day (BID) | ORAL | Status: DC

## 2013-10-17 NOTE — ED Provider Notes (Signed)
CSN: [id number]     Arrival date & time 10/17/13  1120 History  This chart was scribed for non-physician practitioner Margarita Mail working with No att. providers found by Donato Schultz, ED Scribe. This patient was seen in room TR05C/TR05C and the patient's care was started at 12:35 PM.      Chief Complaint  Patient presents with  . Wrist Pain    Patient is a 28 y.o. female presenting with wrist pain. The history is provided by the patient. No language interpreter was used.  Wrist Pain   HPI Comments: Glenda Romero is a 28 y.o. female who presents to the Emergency Department complaining of constant right wrist pain that started 3 weeks ago after she twisted her wrist handling a fryer at work. She states that using her right wrist and picking up the fryer at work aggravates the pain.  She lists associated intermittent numbness and tingling in her right wrist that started 2 days ago.  She denies any previous injuries to her right wrist.     Past Medical History  Diagnosis Date  . Obesity    Past Surgical History  Procedure Laterality Date  . Tonsillectomy     History reviewed. No pertinent family history. History  Substance Use Topics  . Smoking status: Never Smoker   . Smokeless tobacco: Not on file  . Alcohol Use: No   OB History   Grav Para Term Preterm Abortions TAB SAB Ect Mult Living   3 1 1  0 0 0 0 0 0 1     Review of Systems  Musculoskeletal: Positive for arthralgias.  All other systems reviewed and are negative.     Allergies  Review of patient's allergies indicates no known allergies.  Home Medications   Prior to Admission medications   Medication Sig Start Date End Date Taking? Authorizing Provider  ibuprofen (ADVIL,MOTRIN) 800 MG tablet Take 1 tablet (800 mg total) by mouth every 8 (eight) hours as needed for pain. 10/26/12   Clayton Bibles, PA-C  naproxen (NAPROSYN) 500 MG tablet Take 1 tablet (500 mg total) by mouth 2 (two) times daily with a meal. 10/17/13    Shanika Levings, PA-C   BP 133/90  Pulse 70  Temp(Src) 97.6 F (36.4 C) (Oral)  Resp 18  SpO2 95%  Physical Exam  Nursing note and vitals reviewed. Constitutional: She is oriented to person, place, and time. She appears well-developed and well-nourished.  HENT:  Head: Normocephalic and atraumatic.  Eyes: EOM are normal.  Neck: Normal range of motion.  Cardiovascular: Normal rate.   Pulmonary/Chest: Effort normal.  Musculoskeletal: Normal range of motion.  Wrist exam: normal right wrist exam, no swelling, tenderness, instability. Ligaments intact, FROM all joints  soft tissue tenderness at the lateral wrist ligaments, snuffbox tenderness present, positive tinel's sign  radial pulse normal, sensation normal, normal contralateral hand and wrist, distal pulse normal. X-ray: no fracture or dislocation noted.      Neurological: She is alert and oriented to person, place, and time.  Skin: Skin is warm and dry.  Psychiatric: She has a normal mood and affect. Her behavior is normal.    ED Course  Procedures (including critical care time)  DIAGNOSTIC STUDIES: Oxygen Saturation is 95% on room air, adequate by my interpretation.    COORDINATION OF CARE: 12:38 PM- Advised the patient to ice her right wrist and follow up with an orthopedist.  Will discharge patient with a right wrist splint.  The patient agreed to the treatment  plan.   Labs Review Labs Reviewed - No data to display  Imaging Review No results found.   EKG Interpretation None      MDM   Final diagnoses:  Wrist sprain, right, initial encounter  Carpal tunnel syndrome of right wrist    Patient X-Ray negative for obvious fracture or dislocation. Pain managed in ED. Pt advised to follow up with orthopedics if symptoms persist for possibility of missed fracture diagnosis. Patient given brace while in ED, conservative therapy recommended and discussed. Patient will be dc home & is agreeable with above  plan.    I personally performed the services described in this documentation, which was scribed in my presence. The recorded information has been reviewed and is accurate.      Margarita Mail, PA-C 10/21/13 2155

## 2013-10-17 NOTE — Discharge Instructions (Signed)

## 2013-10-17 NOTE — ED Notes (Signed)
Pt c/o right wrist pain x 2 weeks

## 2013-10-22 NOTE — ED Provider Notes (Signed)
Medical screening examination/treatment/procedure(s) were performed by non-physician practitioner and as supervising physician I was immediately available for consultation/collaboration.  Richarda Blade, MD 10/22/13 [phone]

## 2013-12-09 ENCOUNTER — Encounter (HOSPITAL_COMMUNITY): Payer: Self-pay | Admitting: Emergency Medicine

## 2014-06-04 ENCOUNTER — Encounter (HOSPITAL_COMMUNITY): Payer: Self-pay | Admitting: Emergency Medicine

## 2014-06-04 ENCOUNTER — Emergency Department (HOSPITAL_COMMUNITY)
Admission: EM | Admit: 2014-06-04 | Discharge: 2014-06-05 | Disposition: A | Discharge status: Home / Self Care | Payer: Medicaid Other | Attending: Emergency Medicine | Admitting: Emergency Medicine

## 2014-06-04 DIAGNOSIS — E669 Obesity, unspecified: Secondary | ICD-10-CM | POA: Diagnosis not present

## 2014-06-04 DIAGNOSIS — Z791 Long term (current) use of non-steroidal anti-inflammatories (NSAID): Secondary | ICD-10-CM | POA: Diagnosis not present

## 2014-06-04 DIAGNOSIS — Z3202 Encounter for pregnancy test, result negative: Secondary | ICD-10-CM | POA: Insufficient documentation

## 2014-06-04 DIAGNOSIS — M25511 Pain in right shoulder: Secondary | ICD-10-CM | POA: Diagnosis not present

## 2014-06-04 NOTE — ED Provider Notes (Signed)
CSN: [id number]     Arrival date & time 06/04/14  2342 History  This chart was scribed for non-physician practitioner, Britt Bottom, NP, working with Everlene Balls MD, by Delphia Grates, ED Scribe. This patient was seen in room TR06C/TR06C and the patient's care was started at 11:59 PM.     Chief Complaint  Patient presents with  . Arm Pain   The history is provided by the patient. No language interpreter was used.     HPI Comments: Glenda Romero is a 29 y.o. female who presents to the Emergency Department complaining of gradually worsening, constant, 7/10, right upper arm pain for the 8 days. Patient denies any prior falls, injury, or trauma. She notes the pain is worse with movement. 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 She denies history of PE/DVT or recent IVs. LNMP over one month ago on April 24, 2014.   Past Medical History  Diagnosis Date  . Obesity    Past Surgical History  Procedure Laterality Date  . Tonsillectomy     No family history on file. History  Substance Use Topics  . Smoking status: Never Smoker   . Smokeless tobacco: Not on file  . Alcohol Use: No   OB History    Gravida Para Term Preterm AB TAB SAB Ectopic Multiple Living   3 1 1  0 0 0 0 0 0 1     Review of Systems  Musculoskeletal: Positive for myalgias and arthralgias. Negative for joint swelling.  Neurological: Negative for weakness and numbness.      Allergies  Review of patient's allergies indicates no known allergies.  Home Medications   Prior to Admission medications   Medication Sig Start Date End Date Taking? Authorizing Provider  ibuprofen (ADVIL,MOTRIN) 800 MG tablet Take 1 tablet (800 mg total) by mouth every 8 (eight) hours as needed for pain. 10/26/12   Clayton Bibles, PA-C  naproxen (NAPROSYN) 500 MG tablet Take 1 tablet (500 mg total) by mouth 2 (two) times daily with a meal. 10/17/13   Margarita Mail, PA-C   Triage Vitals: BP 130/75 mmHg  Pulse 86   Temp(Src) 97.9 F (36.6 C)  Resp 18  SpO2 95%  LMP 04/16/2014  Physical Exam  Constitutional: She is oriented to person, place, and time. She appears well-developed and well-nourished. No distress.  HENT:  Head: Normocephalic and atraumatic.  Eyes: Conjunctivae and EOM are normal. Right eye exhibits no discharge. Left eye exhibits no discharge. No scleral icterus.  Neck: Neck supple. No tracheal deviation present.  Cardiovascular: Normal rate and intact distal pulses.   Pulmonary/Chest: Effort normal. No respiratory distress.  Musculoskeletal: Normal range of motion. She exhibits tenderness. She exhibits no edema.  Neurological: She is alert and oriented to person, place, and time. No sensory deficit.  5/5 strength flexion and extension and abduction in right shoulder  Skin: Skin is warm and dry. She is not diaphoretic.  Psychiatric: She has a normal mood and affect.  Nursing note and vitals reviewed.   ED Course  Procedures (including critical care time)  DIAGNOSTIC STUDIES: Oxygen Saturation is 95% on room air, normal by my interpretation.    COORDINATION OF CARE: At 0002 Discussed treatment plan with patient which includes pregnancy test and possible imaging. Patient agrees.   Labs Review Labs Reviewed - No data to display  Imaging Review Dg Shoulder Right  06/05/2014   CLINICAL DATA:  29 year old female with 8 day history of right upper  arm pain. No known injury.  EXAM: RIGHT SHOULDER - 2+ VIEW  COMPARISON:  None.  FINDINGS: There is no evidence of fracture or dislocation. There is no evidence of arthropathy or other focal bone abnormality. Soft tissues are unremarkable.  IMPRESSION: Negative.   Electronically Signed   By: Jacqulynn Cadet M.D.   On: 06/05/2014 00:29     EKG Interpretation None      MDM   Final diagnoses:  Right shoulder pain   29 yo reproducible right shoulder pain without definite injury but overuse with repetitive motion at work. Her X-Ray  is negative for obvious fracture or dislocation or acute abnormality. Her pain was managed in ED. Sling provided and referral provided to follow up with orthopedics if symptoms persist. Conservative therapy recommended and discussed. Patient will be dc home & is agreeable with above plan.   I personally performed the services described in this documentation, which was scribed in my presence. The recorded information has been reviewed and is accurate.  Filed Vitals:   06/04/14 2357 06/05/14 0117  BP: 130/75 134/85  Pulse: 86 81  Temp: 97.9 F (36.6 C)   Resp: 18 18  SpO2: 95% 97%   Meds given in ED:  Medications  oxyCODONE-acetaminophen (PERCOCET/ROXICET) 5-325 MG per tablet 1 tablet (1 tablet Oral Given 06/05/14 0102)  naproxen (NAPROSYN) tablet 500 mg (500 mg Oral Given 06/05/14 0102)    Discharge Medication List as of 06/05/2014  1:15 AM    START taking these medications   Details  HYDROcodone-acetaminophen (NORCO/VICODIN) 5-325 MG per tablet Take 1 tablet by mouth every 4 (four) hours as needed., Starting 06/05/2014, Until Discontinued, Print           Britt Bottom, NP 06/06/14 2254  Everlene Balls, MD 06/07/14 [phone]

## 2014-06-04 NOTE — ED Notes (Signed)
R upper arm pain x 1 week that is worse with movement.  Denies known injury but believes pain is related to her job (works in [employer]).

## 2014-06-05 ENCOUNTER — Emergency Department (HOSPITAL_COMMUNITY): Payer: Medicaid Other

## 2014-06-05 LAB — POC URINE PREG, ED: Preg Test, Ur: NEGATIVE

## 2014-06-05 MED ORDER — NAPROXEN 250 MG PO TABS
500.0000 mg | ORAL_TABLET | Freq: Once | ORAL | Status: AC
  Administered 2014-06-05: 500 mg via ORAL
  Filled 2014-06-05: qty 2

## 2014-06-05 MED ORDER — HYDROCODONE-ACETAMINOPHEN 5-325 MG PO TABS: 1.0000 | ORAL_TABLET | ORAL | Status: DC | PRN

## 2014-06-05 MED ORDER — NAPROXEN 500 MG PO TABS: 500.0000 mg | ORAL_TABLET | Freq: Two times a day (BID) | ORAL | Status: DC

## 2014-06-05 MED ORDER — OXYCODONE-ACETAMINOPHEN 5-325 MG PO TABS
1.0000 | ORAL_TABLET | Freq: Once | ORAL | Status: AC
  Administered 2014-06-05: 1 via ORAL
  Filled 2014-06-05: qty 1

## 2014-06-05 NOTE — Discharge Instructions (Signed)
Please follow directions provided. Be sure to follow-up with orthopedic doctor to ensure you're getting better. May take the naproxen twice a day to help with pain and inflammation. For pain not relieved by the naproxen you may take the Vicodin as needed. Where your sling for comfort. Don't hesitate to return for any new, worsening, or concerning symptoms.   SEEK IMMEDIATE MEDICAL CARE IF:  Your arm, hand, or fingers are numb or tingling.  Your arm, hand, or fingers are significantly swollen or turn white or blue.

## 2014-06-05 NOTE — ED Notes (Signed)
POC preg negative.

## 2014-07-17 ENCOUNTER — Encounter (HOSPITAL_COMMUNITY): Payer: Self-pay | Admitting: Emergency Medicine

## 2014-07-17 DIAGNOSIS — N938 Other specified abnormal uterine and vaginal bleeding: Secondary | ICD-10-CM | POA: Diagnosis not present

## 2014-07-17 DIAGNOSIS — E669 Obesity, unspecified: Secondary | ICD-10-CM | POA: Diagnosis not present

## 2014-07-17 DIAGNOSIS — A5901 Trichomonal vulvovaginitis: Secondary | ICD-10-CM | POA: Diagnosis not present

## 2014-07-17 DIAGNOSIS — Z3202 Encounter for pregnancy test, result negative: Secondary | ICD-10-CM | POA: Diagnosis not present

## 2014-07-17 NOTE — ED Notes (Signed)
Pt. reports vaginal bleeding for 3 weeks with intermittent low abdominal cramping , denies emesis or diarrhea/ no fever or chills.

## 2014-07-18 ENCOUNTER — Emergency Department (HOSPITAL_COMMUNITY)
Admission: EM | Admit: 2014-07-18 | Discharge: 2014-07-18 | Disposition: A | Discharge status: Home / Self Care | Payer: Medicaid Other | Attending: Emergency Medicine | Admitting: Emergency Medicine

## 2014-07-18 DIAGNOSIS — N939 Abnormal uterine and vaginal bleeding, unspecified: Secondary | ICD-10-CM

## 2014-07-18 DIAGNOSIS — A599 Trichomoniasis, unspecified: Secondary | ICD-10-CM

## 2014-07-18 LAB — COMPREHENSIVE METABOLIC PANEL
ALK PHOS: 75 U/L (ref 38–126)
ALT: 15 U/L (ref 14–54)
AST: 17 U/L (ref 15–41)
Albumin: 3.7 g/dL (ref 3.5–5.0)
Anion gap: 11 (ref 5–15)
BILIRUBIN TOTAL: 0.2 mg/dL — AB (ref 0.3–1.2)
BUN: 15 mg/dL (ref 6–20)
CO2: 24 mmol/L (ref 22–32)
CREATININE: 0.86 mg/dL (ref 0.44–1.00)
Calcium: 9.4 mg/dL (ref 8.9–10.3)
Chloride: 103 mmol/L (ref 101–111)
GFR calc non Af Amer: >60 mL/min (ref >60)
Glucose, Bld: 109 mg/dL — ABNORMAL HIGH (ref 65–99)
Potassium: 3.9 mmol/L (ref 3.5–5.1)
Sodium: 138 mmol/L (ref 135–145)
Total Protein: 7 g/dL (ref 6.5–8.1)

## 2014-07-18 LAB — URINALYSIS, ROUTINE W REFLEX MICROSCOPIC
BILIRUBIN URINE: NEGATIVE
GLUCOSE, UA: NEGATIVE mg/dL
Ketones, ur: NEGATIVE mg/dL
Leukocytes, UA: NEGATIVE
Nitrite: NEGATIVE
Protein, ur: NEGATIVE mg/dL
Specific Gravity, Urine: 1.034 — ABNORMAL HIGH (ref 1.005–1.030)
Urobilinogen, UA: 0.2 mg/dL (ref 0.0–1.0)
pH: 5 (ref 5.0–8.0)

## 2014-07-18 LAB — WET PREP, GENITAL
CLUE CELLS WET PREP: NONE SEEN
YEAST WET PREP: NONE SEEN

## 2014-07-18 LAB — CBC
HCT: 33.2 % — ABNORMAL LOW (ref 36.0–46.0)
HEMOGLOBIN: 10.5 g/dL — AB (ref 12.0–15.0)
MCH: 28.4 pg (ref 26.0–34.0)
MCHC: 31.6 g/dL (ref 30.0–36.0)
MCV: 89.7 fL (ref 78.0–100.0)
PLATELETS: 329 10*3/uL (ref 150–400)
RBC: 3.7 MIL/uL — ABNORMAL LOW (ref 3.87–5.11)
RDW: 13.7 % (ref 11.5–15.5)
WBC: 10.6 10*3/uL — AB (ref 4.0–10.5)

## 2014-07-18 LAB — URINE MICROSCOPIC-ADD ON

## 2014-07-18 LAB — POC URINE PREG, ED: Preg Test, Ur: NEGATIVE

## 2014-07-18 LAB — GC/CHLAMYDIA PROBE AMP (~~LOC~~) NOT AT ARMC
Chlamydia: POSITIVE — AB
Neisseria Gonorrhea: NEGATIVE

## 2014-07-18 MED ORDER — METRONIDAZOLE 500 MG PO TABS: 1000.0000 mg | ORAL_TABLET | Freq: Two times a day (BID) | ORAL | Status: DC

## 2014-07-18 MED ORDER — AZITHROMYCIN 250 MG PO TABS
1000.0000 mg | ORAL_TABLET | Freq: Once | ORAL | Status: AC
  Administered 2014-07-18: 1000 mg via ORAL
  Filled 2014-07-18: qty 4

## 2014-07-18 MED ORDER — CEFTRIAXONE SODIUM 250 MG IJ SOLR
250.0000 mg | Freq: Once | INTRAMUSCULAR | Status: AC
  Administered 2014-07-18: 250 mg via INTRAMUSCULAR
  Filled 2014-07-18: qty 250

## 2014-07-18 MED ORDER — LIDOCAINE HCL (PF) 1 % IJ SOLN
INTRAMUSCULAR | Status: AC
Start: 2014-07-18 — End: 2014-07-18
  Administered 2014-07-18: 0.9 mL
  Filled 2014-07-18: qty 5

## 2014-07-18 NOTE — ED Provider Notes (Signed)
CSN: [id number]     Arrival date & time 07/17/14  2334 History   First MD Initiated Contact with Patient 07/18/14 0015     This chart was scribed for Debby Freiberg, MD by Forrestine Him, ED Scribe. This patient was seen in room D35C/D35C and the patient's care was started 12:28 AM.   Chief Complaint  Patient presents with  . Vaginal Bleeding   Patient is a 29 y.o. female presenting with vaginal bleeding. The history is provided by the patient. No language interpreter was used.  Vaginal Bleeding Quality:  Bright red Severity:  Moderate Onset quality:  Gradual Duration:  3 weeks Timing:  Constant Progression:  Unchanged Chronicity:  New Context: at rest   Relieved by:  None tried Worsened by:  Position and activity Ineffective treatments:  None tried Associated symptoms: abdominal pain   Associated symptoms: no fever and no nausea     HPI Comments: Glenda Romero is a 29 y.o. female who presents to the Emergency Department complaining of constant, ongoing, unchanged vaginal bleeding x 3 weeks. Bleeding is made worse with activity and certain positioning. Pt reports going through a whole box of tampons daily due to soaking through. In addition, pt states she typically has to change clothing due to blood leaking through her pants. She also reports ongoing lower abdominal pain described as cramping. No aggravating or alleviating factors. No OTC medications or home remedies attempted prior to arrival for abdominal pain. No fever, chills, nausea, vomiting, diarrhea. No known allergies to medications.  Past Medical History  Diagnosis Date  . Obesity    Past Surgical History  Procedure Laterality Date  . Tonsillectomy     No family history on file. History  Substance Use Topics  . Smoking status: Never Smoker   . Smokeless tobacco: Not on file  . Alcohol Use: No   OB History    Gravida Para Term Preterm AB TAB SAB Ectopic Multiple Living   3 1 1  0 0 0 0 0 0 1     Review of Systems   Constitutional: Negative for fever and chills.  Gastrointestinal: Positive for abdominal pain. Negative for nausea, vomiting, diarrhea and constipation.  Genitourinary: Positive for vaginal bleeding.  All other systems reviewed and are negative.     Allergies  Review of patient's allergies indicates no known allergies.  Home Medications   Prior to Admission medications   Medication Sig Start Date End Date Taking? Authorizing Provider  metroNIDAZOLE (FLAGYL) 500 MG tablet Take 2 tablets (1,000 mg total) by mouth 2 (two) times daily. One po bid x 7 days 07/18/14   Debby Freiberg, MD   Triage Vitals: BP 122/74 mmHg  Pulse 81  Temp(Src) 98.3 F (36.8 C) (Oral)  Resp 16  SpO2 100%  LMP    Physical Exam  Constitutional: She is oriented to person, place, and time. She appears well-developed and well-nourished.  HENT:  Head: Normocephalic and atraumatic.  Right Ear: External ear normal.  Left Ear: External ear normal.  Eyes: Conjunctivae and EOM are normal. Pupils are equal, round, and reactive to light.  Neck: Normal range of motion. Neck supple.  Cardiovascular: Normal rate, regular rhythm, normal heart sounds and intact distal pulses.   Pulmonary/Chest: Effort normal and breath sounds normal.  Abdominal: Soft. Bowel sounds are normal. There is no tenderness.  Genitourinary: Cervix exhibits discharge (small amount of blood). Cervix exhibits no motion tenderness and no friability.  Musculoskeletal: Normal range of motion.  Neurological: She is alert  and oriented to person, place, and time.  Skin: Skin is warm and dry.  Vitals reviewed.   ED Course  Procedures (including critical care time)  DIAGNOSTIC STUDIES: Oxygen Saturation is 97% on RA, adequate by my interpretation.    COORDINATION OF CARE: 12:33 AM- Will order urinalysis, wet prep, CMP, urine pregnancy, CBC. Discussed treatment plan with pt at bedside and pt agreed to plan.     Labs Review Labs Reviewed  WET  PREP, GENITAL - Abnormal; Notable for the following:    Trich, Wet Prep FEW (*)    WBC, Wet Prep HPF POC FEW (*)    All other components within normal limits  CBC - Abnormal; Notable for the following:    WBC 10.6 (*)    RBC 3.70 (*)    Hemoglobin 10.5 (*)    HCT 33.2 (*)    All other components within normal limits  COMPREHENSIVE METABOLIC PANEL - Abnormal; Notable for the following:    Glucose, Bld 109 (*)    Total Bilirubin 0.2 (*)    All other components within normal limits  URINALYSIS, ROUTINE W REFLEX MICROSCOPIC (NOT AT [facility]) - Abnormal; Notable for the following:    Specific Gravity, Urine 1.034 (*)    Hgb urine dipstick TRACE (*)    All other components within normal limits  URINE MICROSCOPIC-ADD ON  POC URINE PREG, ED  GC/CHLAMYDIA PROBE AMP ([place]) NOT AT [facility]    Imaging Review No results found.   EKG Interpretation None      MDM   Final diagnoses:  Vaginal bleeding  Trichomoniasis    29 y.o. female with pertinent PMH of prior trichomoniasis, obestity presents with vaginal bleeding x 3 weeks as above.  No GI symptoms, no other systemic symptoms.  Exam as above with minimal bleeding, otherwise normal cervix, no cmt or tenderness in adnexa.  Elba Barman with trich.  Treated empirically for STI with rocephin/azithro.  DC home with prescription for flagyl.  To fu with womens for dysfunctional uterine bleeding, hgb stable at this time.  I have reviewed all laboratory and imaging studies if ordered as above  1. Vaginal bleeding   2. Trichomoniasis           Debby Freiberg, MD 07/18/14 [phone]

## 2014-07-18 NOTE — Discharge Instructions (Signed)
Abnormal Uterine Bleeding Abnormal uterine bleeding can affect women at various stages in life, including teenagers, women in their reproductive years, pregnant women, and women who have reached menopause. Several kinds of uterine bleeding are considered abnormal, including:  Bleeding or spotting between periods.   Bleeding after sexual intercourse.   Bleeding that is heavier or more than normal.   Periods that last longer than usual.  Bleeding after menopause.  Many cases of abnormal uterine bleeding are minor and simple to treat, while others are more serious. Any type of abnormal bleeding should be evaluated by your health care provider. Treatment will depend on the cause of the bleeding. HOME CARE INSTRUCTIONS Monitor your condition for any changes. The following actions may help to alleviate any discomfort you are experiencing:  Avoid the use of tampons and douches as directed by your health care provider.  Change your pads frequently. You should get regular pelvic exams and Pap tests. Keep all follow-up appointments for diagnostic tests as directed by your health care provider.  SEEK MEDICAL CARE IF:   Your bleeding lasts more than 1 week.   You feel dizzy at times.  SEEK IMMEDIATE MEDICAL CARE IF:   You pass out.   You are changing pads every 15 to 30 minutes.   You have abdominal pain.  You have a fever.   You become sweaty or weak.   You are passing large blood clots from the vagina.   You start to feel nauseous and vomit. MAKE SURE YOU:   Understand these instructions.  Will watch your condition.  Will get help right away if you are not doing well or get worse. Document Released: 01/24/2005 Document Revised: 01/29/2013 Document Reviewed: 08/23/2012 [facility] Patient Information 2015 [place], [place]. This information is not intended to replace advice given to you by your health care provider. Make sure you discuss any questions you have with your  health care provider.   Trichomoniasis Trichomoniasis is an infection caused by an organism called Trichomonas. The infection can affect both women and men. In women, the outer female genitalia and the vagina are affected. In men, the penis is mainly affected, but the prostate and other reproductive organs can also be involved. Trichomoniasis is a sexually transmitted infection (STI) and is most often passed to another person through sexual contact.  RISK FACTORS  Having unprotected sexual intercourse.  Having sexual intercourse with an infected partner. SIGNS AND SYMPTOMS  Symptoms of trichomoniasis in women include:  Abnormal gray-green frothy vaginal discharge.  Itching and irritation of the vagina.  Itching and irritation of the area outside the vagina. Symptoms of trichomoniasis in men include:   Penile discharge with or without pain.  Pain during urination. This results from inflammation of the urethra. DIAGNOSIS  Trichomoniasis may be found during a Pap test or physical exam. Your health care provider may use one of the following methods to help diagnose this infection:  Examining vaginal discharge under a microscope. For men, urethral discharge would be examined.  Testing the pH of the vagina with a test tape.  Using a vaginal swab test that checks for the Trichomonas organism. A test is available that provides results within a few minutes.  Doing a culture test for the organism. This is not usually needed. TREATMENT   You may be given medicine to fight the infection. Women should inform their health care provider if they could be or are pregnant. Some medicines used to treat the infection should not be taken during  pregnancy.  Your health care provider may recommend over-the-counter medicines or creams to decrease itching or irritation.  Your sexual partner will need to be treated if infected. HOME CARE INSTRUCTIONS   Take medicines only as directed by your health  care provider.  Take over-the-counter medicine for itching or irritation as directed by your health care provider.  Do not have sexual intercourse while you have the infection.  Women should not douche or wear tampons while they have the infection.  Discuss your infection with your partner. Your partner may have gotten the infection from you, or you may have gotten it from your partner.  Have your sex partner get examined and treated if necessary.  Practice safe, informed, and protected sex.  See your health care provider for other STI testing. SEEK MEDICAL CARE IF:   You still have symptoms after you finish your medicine.  You develop abdominal pain.  You have pain when you urinate.  You have bleeding after sexual intercourse.  You develop a rash.  Your medicine makes you sick or makes you throw up (vomit). MAKE SURE YOU:  Understand these instructions.  Will watch your condition.  Will get help right away if you are not doing well or get worse. Document Released: 07/20/2000 Document Revised: 06/10/2013 Document Reviewed: 11/05/2012 [facility] Patient Information 2015 [place], [place]. This information is not intended to replace advice given to you by your health care provider. Make sure you discuss any questions you have with your health care provider.

## 2014-07-21 ENCOUNTER — Telehealth (HOSPITAL_COMMUNITY): Payer: Self-pay

## 2014-07-21 NOTE — ED Notes (Signed)
Positive for chlamydia. Treated per protocol. [employer]HS form faxed.  Attempting to contact pt.

## 2014-07-22 ENCOUNTER — Telehealth: Payer: Self-pay | Admitting: Emergency Medicine

## 2014-07-23 ENCOUNTER — Telehealth (HOSPITAL_COMMUNITY): Payer: Self-pay

## 2014-07-23 NOTE — Telephone Encounter (Signed)
Unable to reach by telephone. Letter sent to address on record.  

## 2014-09-28 ENCOUNTER — Emergency Department (HOSPITAL_COMMUNITY)
Admission: EM | Admit: 2014-09-28 | Discharge: 2014-09-29 | Disposition: A | Discharge status: Home / Self Care | Payer: Medicaid Other

## 2014-09-29 NOTE — ED Notes (Signed)
Called name and pt did not show

## 2014-09-29 NOTE — ED Notes (Signed)
Call and no show again.

## 2014-10-25 ENCOUNTER — Encounter (HOSPITAL_COMMUNITY): Payer: Self-pay | Admitting: *Deleted

## 2014-10-25 ENCOUNTER — Emergency Department (HOSPITAL_COMMUNITY)
Admission: EM | Admit: 2014-10-25 | Discharge: 2014-10-25 | Disposition: A | Discharge status: Home / Self Care | Payer: Medicaid Other | Attending: Emergency Medicine | Admitting: Emergency Medicine

## 2014-10-25 DIAGNOSIS — Z792 Long term (current) use of antibiotics: Secondary | ICD-10-CM | POA: Diagnosis not present

## 2014-10-25 DIAGNOSIS — M25511 Pain in right shoulder: Secondary | ICD-10-CM | POA: Insufficient documentation

## 2014-10-25 DIAGNOSIS — E669 Obesity, unspecified: Secondary | ICD-10-CM | POA: Diagnosis not present

## 2014-10-25 MED ORDER — TRAMADOL HCL 50 MG PO TABS
50.0000 mg | ORAL_TABLET | Freq: Four times a day (QID) | ORAL | Status: DC | PRN
Start: 2014-10-25 — End: 2015-02-15

## 2014-10-25 MED ORDER — PREDNISONE 10 MG PO TABS: 20.0000 mg | ORAL_TABLET | Freq: Two times a day (BID) | ORAL | Status: DC

## 2014-10-25 NOTE — ED Notes (Signed)
The pt is c/o rt shoulder pain for 2 weeks.  No known injury.  She was seen 2 months ago for pain in that same shoulder  lmp  july

## 2014-10-25 NOTE — ED Notes (Signed)
Pt states that she has been using tylenol and ibuprofen for pain.

## 2014-10-25 NOTE — Discharge Instructions (Signed)
Wear arm sling as applied.  Prednisone as prescribed. Tramadol as prescribed as needed for pain.  Follow-up with your primary Dr. if not improving in the next week.   Shoulder Pain The shoulder is the joint that connects your arms to your body. The bones that form the shoulder joint include the upper arm bone (humerus), the shoulder blade (scapula), and the collarbone (clavicle). The top of the humerus is shaped like a ball and fits into a rather flat socket on the scapula (glenoid cavity). A combination of muscles and strong, fibrous tissues that connect muscles to bones (tendons) support your shoulder joint and hold the ball in the socket. Small, fluid-filled sacs (bursae) are located in different areas of the joint. They act as cushions between the bones and the overlying soft tissues and help reduce friction between the gliding tendons and the bone as you move your arm. Your shoulder joint allows a wide range of motion in your arm. This range of motion allows you to do things like scratch your back or throw a ball. However, this range of motion also makes your shoulder more prone to pain from overuse and injury. Causes of shoulder pain can originate from both injury and overuse and usually can be grouped in the following four categories:  Redness, swelling, and pain (inflammation) of the tendon (tendinitis) or the bursae (bursitis).  Instability, such as a dislocation of the joint.  Inflammation of the joint (arthritis).  Broken bone (fracture). HOME CARE INSTRUCTIONS   Apply ice to the sore area.  Put ice in a plastic bag.  Place a towel between your skin and the bag.  Leave the ice on for 15-20 minutes, 3-4 times per day for the first 2 days, or as directed by your health care provider.  Stop using cold packs if they do not help with the pain.  If you have a shoulder sling or immobilizer, wear it as long as your caregiver instructs. Only remove it to shower or bathe. Move your arm  as little as possible, but keep your hand moving to prevent swelling.  Squeeze a soft ball or foam pad as much as possible to help prevent swelling.  Only take over-the-counter or prescription medicines for pain, discomfort, or fever as directed by your caregiver. SEEK MEDICAL CARE IF:   Your shoulder pain increases, or new pain develops in your arm, hand, or fingers.  Your hand or fingers become cold and numb.  Your pain is not relieved with medicines. SEEK IMMEDIATE MEDICAL CARE IF:   Your arm, hand, or fingers are numb or tingling.  Your arm, hand, or fingers are significantly swollen or turn white or blue. MAKE SURE YOU:   Understand these instructions.  Will watch your condition.  Will get help right away if you are not doing well or get worse. Document Released: 11/03/2004 Document Revised: 06/10/2013 Document Reviewed: 01/08/2011 [facility] Patient Information 2015 [place], [place]. This information is not intended to replace advice given to you by your health care provider. Make sure you discuss any questions you have with your health care provider.

## 2014-10-25 NOTE — ED Provider Notes (Signed)
CSN: [id number]   Arrival date & time 10/25/14 0018  History  This chart was scribed for Veryl Speak, MD by Altamease Oiler, ED Scribe. This patient was seen in room A09C/A09C and the patient's care was started at 2:06 AM.  Chief Complaint  Patient presents with  . Shoulder Pain    HPI The history is provided by the patient. No language interpreter was used.   Ionia Schey is a 29 y.o. female who presents to the Emergency Department complaining of anterior right shoulder pain with onset 2 weeks ago. Pt states that her work involves lifting and pulling but she denies discrete trauma. She rates the pain 7/10 in severity and describes it as pulling. The pain is exacerbated by lifting the arm. She was seen months ago where she had an XR. AT that time she was discharged with a sling that in initially provided some relief. Pt denies numbness and tingling.   Past Medical History  Diagnosis Date  . Obesity     Past Surgical History  Procedure Laterality Date  . Tonsillectomy      No family history on file.  Social History  Substance Use Topics  . Smoking status: Never Smoker   . Smokeless tobacco: None  . Alcohol Use: No     Review of Systems 10 Systems reviewed and all are negative for acute change except as noted in the HPI. Home Medications   Prior to Admission medications   Medication Sig Start Date End Date Taking? Authorizing Provider  metroNIDAZOLE (FLAGYL) 500 MG tablet Take 2 tablets (1,000 mg total) by mouth 2 (two) times daily. One po bid x 7 days 07/18/14   Debby Freiberg, MD    Allergies  Review of patient's allergies indicates no known allergies.  Triage Vitals: BP 120/76 mmHg  Pulse 83  Temp(Src) 98.6 F (37 C)  Resp 18  Ht 6\' 6"  (1.981 m)  Wt 309 lb 2 oz (140.218 kg)  BMI 35.73 kg/m2  SpO2 97%  LMP 08/24/2014  Physical Exam  Constitutional: She is oriented to person, place, and time. She appears well-developed and well-nourished.  HENT:  Head:  Normocephalic.  Eyes: EOM are normal.  Neck: Normal range of motion.  Pulmonary/Chest: Effort normal.  Abdominal: She exhibits no distension.  Musculoskeletal: Normal range of motion.  There is TTP over the anterior aspect of the right shoulder. Good ROM with mild to moderate discomfort. Distal ulnar and radial pulses are palpable. She can flex, extend, and oppose all fingers.   Neurological: She is alert and oriented to person, place, and time.  Psychiatric: She has a normal mood and affect.  Nursing note and vitals reviewed.   ED Course  Procedures   DIAGNOSTIC STUDIES: Oxygen Saturation is 97% on RA, normal by my interpretation.    COORDINATION OF CARE: 2:10 AM Discussed treatment plan which includes tramadol, prednisone, and a sling with pt at bedside and pt agreed to plan.  Labs Reviewed - No data to display  Imaging Review No results found.    MDM   Final diagnoses:  None     Patient presents with complaints of shoulder pain that is likely related to an overuse syndrome. Her physical examination is unremarkable. She had x-rays several weeks ago I do not feel as though repeating them as indicated as there is been no new injury or trauma. She will be treated with steroids, to return prn.   I personally performed the services described in this documentation, which was  scribed in my presence. The recorded information has been reviewed and is accurate.     Veryl Speak, MD 10/25/14 [phone]

## 2014-10-25 NOTE — ED Notes (Signed)
This RN gave pt ice pack for pain.

## 2014-10-25 NOTE — ED Notes (Signed)
MD at bedside. 

## 2015-02-14 ENCOUNTER — Emergency Department (HOSPITAL_COMMUNITY)
Admission: EM | Admit: 2015-02-14 | Discharge: 2015-02-15 | Disposition: A | Discharge status: Home / Self Care | Payer: Medicaid Other | Attending: Emergency Medicine | Admitting: Emergency Medicine

## 2015-02-14 ENCOUNTER — Encounter (HOSPITAL_COMMUNITY): Payer: Self-pay

## 2015-02-14 DIAGNOSIS — E669 Obesity, unspecified: Secondary | ICD-10-CM | POA: Insufficient documentation

## 2015-02-14 DIAGNOSIS — R509 Fever, unspecified: Secondary | ICD-10-CM | POA: Insufficient documentation

## 2015-02-14 DIAGNOSIS — R05 Cough: Secondary | ICD-10-CM | POA: Diagnosis not present

## 2015-02-14 DIAGNOSIS — J029 Acute pharyngitis, unspecified: Secondary | ICD-10-CM | POA: Diagnosis not present

## 2015-02-14 DIAGNOSIS — R0981 Nasal congestion: Secondary | ICD-10-CM | POA: Diagnosis present

## 2015-02-14 DIAGNOSIS — R6889 Other general symptoms and signs: Secondary | ICD-10-CM

## 2015-02-14 MED ORDER — ACETAMINOPHEN 325 MG PO TABS
650.0000 mg | ORAL_TABLET | Freq: Once | ORAL | Status: AC | PRN
  Administered 2015-02-14: 650 mg via ORAL

## 2015-02-14 MED ORDER — ACETAMINOPHEN 325 MG PO TABS
ORAL_TABLET | ORAL | Status: AC
  Filled 2015-02-14: qty 2

## 2015-02-14 NOTE — ED Notes (Signed)
Pt here with c/o headaches, muscle aches, nasal congestion and cough; onset 3 days ago. Temp at triage 102.8. OTC meds have not helped. Pt reports nausea and diarrhea but denies vomiting.

## 2015-02-15 ENCOUNTER — Telehealth: Payer: Self-pay | Admitting: *Deleted

## 2015-02-15 ENCOUNTER — Emergency Department (HOSPITAL_COMMUNITY): Payer: Medicaid Other

## 2015-02-15 LAB — CBC WITH DIFFERENTIAL/PLATELET
BASOS ABS: 0 10*3/uL (ref 0.0–0.1)
Basophils Relative: 0 %
EOS ABS: 0 10*3/uL (ref 0.0–0.7)
Eosinophils Relative: 0 %
HEMATOCRIT: 37.9 % (ref 36.0–46.0)
HEMOGLOBIN: 11.8 g/dL — AB (ref 12.0–15.0)
Lymphocytes Relative: 18 %
Lymphs Abs: 1.7 10*3/uL (ref 0.7–4.0)
MCH: 27 pg (ref 26.0–34.0)
MCHC: 31.1 g/dL (ref 30.0–36.0)
MCV: 86.7 fL (ref 78.0–100.0)
MONOS PCT: 5 %
Monocytes Absolute: 0.5 10*3/uL (ref 0.1–1.0)
NEUTROS ABS: 7.2 10*3/uL (ref 1.7–7.7)
NEUTROS PCT: 77 %
Platelets: 290 10*3/uL (ref 150–400)
RBC: 4.37 MIL/uL (ref 3.87–5.11)
RDW: 15 % (ref 11.5–15.5)
WBC: 9.5 10*3/uL (ref 4.0–10.5)

## 2015-02-15 LAB — BASIC METABOLIC PANEL
ANION GAP: 10 (ref 5–15)
BUN: 9 mg/dL (ref 6–20)
CALCIUM: 9.3 mg/dL (ref 8.9–10.3)
CO2: 24 mmol/L (ref 22–32)
Chloride: 101 mmol/L (ref 101–111)
Creatinine, Ser: 0.86 mg/dL (ref 0.44–1.00)
GFR calc Af Amer: >60 mL/min (ref >60)
GFR calc non Af Amer: >60 mL/min (ref >60)
GLUCOSE: 103 mg/dL — AB (ref 65–99)
POTASSIUM: 4.4 mmol/L (ref 3.5–5.1)
SODIUM: 135 mmol/L (ref 135–145)

## 2015-02-15 MED ORDER — ONDANSETRON 4 MG PO TBDP
8.0000 mg | ORAL_TABLET | Freq: Once | ORAL | Status: AC
  Administered 2015-02-15: 8 mg via ORAL
  Filled 2015-02-15: qty 2

## 2015-02-15 MED ORDER — ONDANSETRON 8 MG PO TBDP
8.0000 mg | ORAL_TABLET | Freq: Once | ORAL | Status: DC
Start: 2015-02-15 — End: 2023-11-28

## 2015-02-15 MED ORDER — IBUPROFEN 200 MG PO TABS
600.0000 mg | ORAL_TABLET | Freq: Once | ORAL | Status: AC
  Administered 2015-02-15: 600 mg via ORAL
  Filled 2015-02-15: qty 3

## 2015-02-15 MED ORDER — PSEUDOEPHEDRINE HCL ER 120 MG PO TB12: 120.0000 mg | ORAL_TABLET | Freq: Once | ORAL | Status: DC

## 2015-02-15 MED ORDER — BENZONATATE 100 MG PO CAPS: 100.0000 mg | ORAL_CAPSULE | Freq: Once | ORAL | Status: DC

## 2015-02-15 MED ORDER — IBUPROFEN 600 MG PO TABS
600.0000 mg | ORAL_TABLET | Freq: Once | ORAL | Status: DC
Start: 2015-02-15 — End: 2023-11-28

## 2015-02-15 MED ORDER — BENZONATATE 100 MG PO CAPS
100.0000 mg | ORAL_CAPSULE | Freq: Once | ORAL | Status: AC
  Administered 2015-02-15: 100 mg via ORAL
  Filled 2015-02-15: qty 1

## 2015-02-15 MED ORDER — PSEUDOEPHEDRINE HCL ER 120 MG PO TB12
120.0000 mg | ORAL_TABLET | Freq: Once | ORAL | Status: AC
  Administered 2015-02-15: 120 mg via ORAL
  Filled 2015-02-15: qty 1

## 2015-02-15 NOTE — ED Notes (Signed)
Nanavati, MD at bedside. 

## 2015-02-15 NOTE — ED Provider Notes (Signed)
CSN: [id number]     Arrival date & time 02/14/15  2336 History   First MD Initiated Contact with Patient 02/14/15 2356     Chief Complaint  Patient presents with  . Nasal Congestion  . Fever     (Consider location/radiation/quality/duration/timing/severity/associated sxs/prior Treatment) HPI Comments: SUBJECTIVE:  Glenda Romero is a 30 y.o. female who present complaining of flu-like symptoms: fevers, chills, myalgias, congestion, sore throat and cough for 3 days. Denies dyspnea or wheezing.  OBJECTIVE: Appears moderately ill but not toxic; temperature as noted in vitals. Ears normal. Throat and pharynx normal.  Neck supple. No adenopathy in the neck. Sinuses non tender. The chest is clear.  ASSESSMENT: Influenza  PLAN: Symptomatic therapy suggested: rest, increase fluids, gargle prn for sore throat and call prn if symptoms persist or worsen. Call or return to clinic prn if these symptoms worsen or fail to improve as anticipated.   Patient is a 30 y.o. female presenting with fever. The history is provided by the patient.  Fever   Past Medical History  Diagnosis Date  . Obesity    Past Surgical History  Procedure Laterality Date  . Tonsillectomy     No family history on file. Social History  Substance Use Topics  . Smoking status: Never Smoker   . Smokeless tobacco: None  . Alcohol Use: No   OB History    Gravida Para Term Preterm AB TAB SAB Ectopic Multiple Living   3 1 1  0 0 0 0 0 0 1     Review of Systems  Constitutional: Positive for fever.  All other systems reviewed and are negative.     Allergies  Review of patient's allergies indicates no known allergies.  Home Medications   Prior to Admission medications   Medication Sig Start Date End Date Taking? Authorizing Provider  benzonatate (TESSALON) 100 MG capsule Take 1 capsule (100 mg total) by mouth once. 02/15/15   Varney Biles, MD  ibuprofen (ADVIL,MOTRIN) 600 MG tablet Take 1 tablet (600 mg total) by  mouth once. 02/15/15   Varney Biles, MD  ondansetron (ZOFRAN-ODT) 8 MG disintegrating tablet Take 1 tablet (8 mg total) by mouth once. 02/15/15   Varney Biles, MD  pseudoephedrine (SUDAFED) 120 MG 12 hr tablet Take 1 tablet (120 mg total) by mouth once. 02/15/15   Necie Wilcoxson, MD   BP 106/66 mmHg  Pulse 93  Temp(Src) 100.4 F (38 C) (Oral)  Resp 20  SpO2 99%  LMP 01/25/2015 Physical Exam  Constitutional: She is oriented to person, place, and time. She appears well-developed.  HENT:  Head: Normocephalic and atraumatic.  Eyes: EOM are normal.  Neck: Normal range of motion. Neck supple.  Cardiovascular: Normal rate.   Pulmonary/Chest: Effort normal.  Abdominal: Bowel sounds are normal.  Neurological: She is alert and oriented to person, place, and time.  Skin: Skin is warm and dry.  Nursing note and vitals reviewed.   ED Course  Procedures (including critical care time) Labs Review Labs Reviewed  CBC WITH DIFFERENTIAL/PLATELET - Abnormal; Notable for the following:    Hemoglobin 11.8 (*)    All other components within normal limits  BASIC METABOLIC PANEL - Abnormal; Notable for the following:    Glucose, Bld 103 (*)    All other components within normal limits    Imaging Review Dg Chest 2 View  02/15/2015  CLINICAL DATA:  COUGH/CONG, SOB, SORE THROAT, FEVER, BODY ACHES ALL X 3 DAYS, NONSMOKER. EXAM: CHEST  2 VIEW COMPARISON:  None. FINDINGS: The heart size and mediastinal contours are within normal limits. Both lungs are clear. No pleural effusion or pneumothorax. The visualized skeletal structures are unremarkable. IMPRESSION: Normal chest radiographs. Electronically Signed   By: Lajean Manes M.D.   On: 02/15/2015 00:11   I have personally reviewed and evaluated these images and lab results as part of my medical decision-making.   EKG Interpretation None      MDM   Final diagnoses:  Flu-like symptoms        Varney Biles, MD 02/15/15 [phone]

## 2015-02-15 NOTE — Discharge Instructions (Signed)

## 2015-02-15 NOTE — Telephone Encounter (Signed)
benzonatate (TESSALON) 100 MG capsule Take 1 capsule (100 mg total) by mouth once. 02/15/15   Varney Biles, MD  ibuprofen (ADVIL,MOTRIN) 600 MG tablet Take 1 tablet (600 mg total) by mouth once. 02/15/15   Varney Biles, MD  ondansetron (ZOFRAN-ODT) 8 MG disintegrating tablet Take 1 tablet (8 mg total) by mouth once. 02/15/15   Varney Biles, MD  pseudoephedrine (SUDAFED) 120 MG 12 hr tablet Take 1 tablet (120 mg total) by mouth once. 02/15/15   Varney Biles, MD       Changed to Tessalon 100 mg po Tid prn Cough, (suggestion of Pharm D), IBU 600 mg  Unavailable 800 mg po tid  With food prn generalized aches and pains , Zofran 1 SL Q 6-8 h prn N/V, and Sudafed 120 mg po q12h prn Congestion.

## 2015-02-17 ENCOUNTER — Emergency Department (HOSPITAL_COMMUNITY)
Admission: EM | Admit: 2015-02-17 | Discharge: 2015-02-17 | Disposition: A | Discharge status: Home / Self Care | Payer: Medicaid Other | Attending: Emergency Medicine | Admitting: Emergency Medicine

## 2015-02-17 ENCOUNTER — Encounter (HOSPITAL_COMMUNITY): Payer: Self-pay | Admitting: Emergency Medicine

## 2015-02-17 ENCOUNTER — Emergency Department (HOSPITAL_COMMUNITY): Payer: Medicaid Other

## 2015-02-17 DIAGNOSIS — R05 Cough: Secondary | ICD-10-CM | POA: Diagnosis present

## 2015-02-17 DIAGNOSIS — Z79899 Other long term (current) drug therapy: Secondary | ICD-10-CM | POA: Insufficient documentation

## 2015-02-17 DIAGNOSIS — R509 Fever, unspecified: Secondary | ICD-10-CM | POA: Diagnosis not present

## 2015-02-17 DIAGNOSIS — Z792 Long term (current) use of antibiotics: Secondary | ICD-10-CM | POA: Insufficient documentation

## 2015-02-17 DIAGNOSIS — J029 Acute pharyngitis, unspecified: Secondary | ICD-10-CM | POA: Insufficient documentation

## 2015-02-17 DIAGNOSIS — Z791 Long term (current) use of non-steroidal anti-inflammatories (NSAID): Secondary | ICD-10-CM | POA: Diagnosis not present

## 2015-02-17 DIAGNOSIS — E669 Obesity, unspecified: Secondary | ICD-10-CM | POA: Diagnosis not present

## 2015-02-17 DIAGNOSIS — R0981 Nasal congestion: Secondary | ICD-10-CM | POA: Diagnosis not present

## 2015-02-17 DIAGNOSIS — J40 Bronchitis, not specified as acute or chronic: Secondary | ICD-10-CM | POA: Diagnosis not present

## 2015-02-17 MED ORDER — AZITHROMYCIN 250 MG PO TABS: 250.0000 mg | ORAL_TABLET | Freq: Every day | ORAL | Status: DC

## 2015-02-17 MED ORDER — AZITHROMYCIN 250 MG PO TABS
500.0000 mg | ORAL_TABLET | Freq: Once | ORAL | Status: AC
  Administered 2015-02-17: 500 mg via ORAL
  Filled 2015-02-17: qty 2

## 2015-02-17 NOTE — ED Notes (Signed)
Here 02/13/15 for URI, given Rx for decongestant, Ibuprofen and cough med. Returns today because cough continues. Yellow sputum.

## 2015-02-17 NOTE — ED Provider Notes (Signed)
CSN: [id number]     Arrival date & time 02/17/15  1030 History  By signing my name below, I, Starleen Arms, attest that this documentation has been prepared under the direction and in the presence of [employer], PA-C. Electronically Signed: Starleen Arms ED Scribe. 02/17/2015. 4:10 PM.    Chief Complaint  Patient presents with  . Follow-up   The history is provided by the patient. No language interpreter was used.   HPI Comments: Glenda Romero is a 30 y.o. female with no chronic conditions who presents to the Emergency Department complaining of a cough productive of yellow phlegm onset 1 week ago.  She reports associated chest congestion.  She says that she feels much better with the medications prescribed which included Sudafed and ibuprofen. She states the Tessalon did not help her cough. The patient was seen 4 days ago in the ED for flu-like symptoms including fevers, chills, myalgias, congestion, sore throat, and cough.  All symptoms with the exception of today's complaints have resolved.  She denies fever.  She denies any smoking history.  Past Medical History  Diagnosis Date  . Obesity    Past Surgical History  Procedure Laterality Date  . Tonsillectomy     No family history on file. Social History  Substance Use Topics  . Smoking status: Never Smoker   . Smokeless tobacco: None  . Alcohol Use: No   OB History    Gravida Para Term Preterm AB TAB SAB Ectopic Multiple Living   3 1 1  0 0 0 0 0 0 1     Review of Systems  Constitutional: Negative for fever and chills.  HENT: Negative for sore throat.   Respiratory: Positive for cough.   Musculoskeletal: Negative for myalgias.      Allergies  Review of patient's allergies indicates no known allergies.  Home Medications   Prior to Admission medications   Medication Sig Start Date End Date Taking? Authorizing Provider  azithromycin (ZITHROMAX) 250 MG tablet Take 1 tablet (250 mg total) by mouth daily. Take 1 every  day until finished beginning tomorrow. You received your first dose in the emergency department 02/17/15   Ottie Glazier, PA-C  benzonatate (TESSALON) 100 MG capsule Take 1 capsule (100 mg total) by mouth once. 02/15/15   Varney Biles, MD  ibuprofen (ADVIL,MOTRIN) 600 MG tablet Take 1 tablet (600 mg total) by mouth once. 02/15/15   Varney Biles, MD  ondansetron (ZOFRAN-ODT) 8 MG disintegrating tablet Take 1 tablet (8 mg total) by mouth once. 02/15/15   Varney Biles, MD  pseudoephedrine (SUDAFED) 120 MG 12 hr tablet Take 1 tablet (120 mg total) by mouth once. 02/15/15   Ankit Nanavati, MD   BP 143/89 mmHg  Pulse 95  Temp(Src) 98.1 F (36.7 C) (Oral)  Resp 20  Ht 5\' 7"  (1.702 m)  Wt 136.533 kg  BMI 47.13 kg/m2  SpO2 99%  LMP 01/25/2015 Physical Exam  Constitutional: She is oriented to person, place, and time. She appears well-developed and well-nourished. No distress.  Obese  HENT:  Head: Normocephalic and atraumatic.  Eyes: Conjunctivae and EOM are normal.  Neck: Neck supple. No tracheal deviation present.  Cardiovascular: Normal rate.   Pulmonary/Chest: Effort normal. No respiratory distress. She has no wheezes.  Difficult to hear breath sounds due to size.  99% O2 on RA with no respiratory distress or use of accessory muscles.   Musculoskeletal: Normal range of motion.  Neurological: She is alert and oriented to person, place, and time.  Skin: Skin is warm and dry.  Psychiatric: She has a normal mood and affect. Her behavior is normal.  Nursing note and vitals reviewed.   ED Course  Procedures (including critical care time)  DIAGNOSTIC STUDIES: Oxygen Saturation is 99% on RA, normal by my interpretation.    COORDINATION OF CARE:  10:56 AM Will order CXR to r/o pneumonia. Patient acknowledges and agrees with plan.    Labs Review Labs Reviewed - No data to display  Imaging Review Dg Chest 2 View  02/17/2015  CLINICAL DATA:  Worsening cough, diagnosed with flu 3 days  ago EXAM: CHEST  2 VIEW COMPARISON:  02/15/2015 FINDINGS: Cardiomediastinal silhouette is stable. No acute infiltrate or pleural effusion. No pulmonary edema. Mild infrahilar bronchitic changes. Bony thorax is stable. IMPRESSION: No acute infiltrate or pulmonary edema. Mild infrahilar bronchitic changes. Electronically Signed   By: Lahoma Crocker M.D.   On: 02/17/2015 11:21   I have personally reviewed and evaluated these image results as part of my medical decision-making.   EKG Interpretation None      MDM   Final diagnoses:  Bronchitis   Patient presents with cough 4 days. Pt CXR negative for acute infiltrate but does show bronchitic changes. Patients symptoms are consistent with URI, likely viral etiology. I prescribed the patient is azithromycin. Pt will be discharged with symptomatic treatment.  Verbalizes understanding and is agreeable with plan. Pt is hemodynamically stable & in NAD prior to dc. Filed Vitals:   02/17/15 1036 02/17/15 1157  BP: 132/75 143/89  Pulse: 83 95  Temp: 98.1 F (36.7 C) 98.1 F (36.7 C)  Resp: 18 20   I personally performed the services described in this documentation, which was scribed in my presence. The recorded information has been reviewed and is accurate.    Ottie Glazier, PA-C 02/17/15 [place], MD 02/18/15 [phone]

## 2017-03-07 IMAGING — CR DG CHEST 2V
2 series · 2 of 2 positions shown · non-contrast
Comparison: None.

CLINICAL DATA: COUGH/MERARI, SOB, SORE THROAT, FEVER, BODY ACHES ALL
X 3 DAYS, NONSMOKER.

EXAM:
CHEST  2 VIEW

[chest pa]
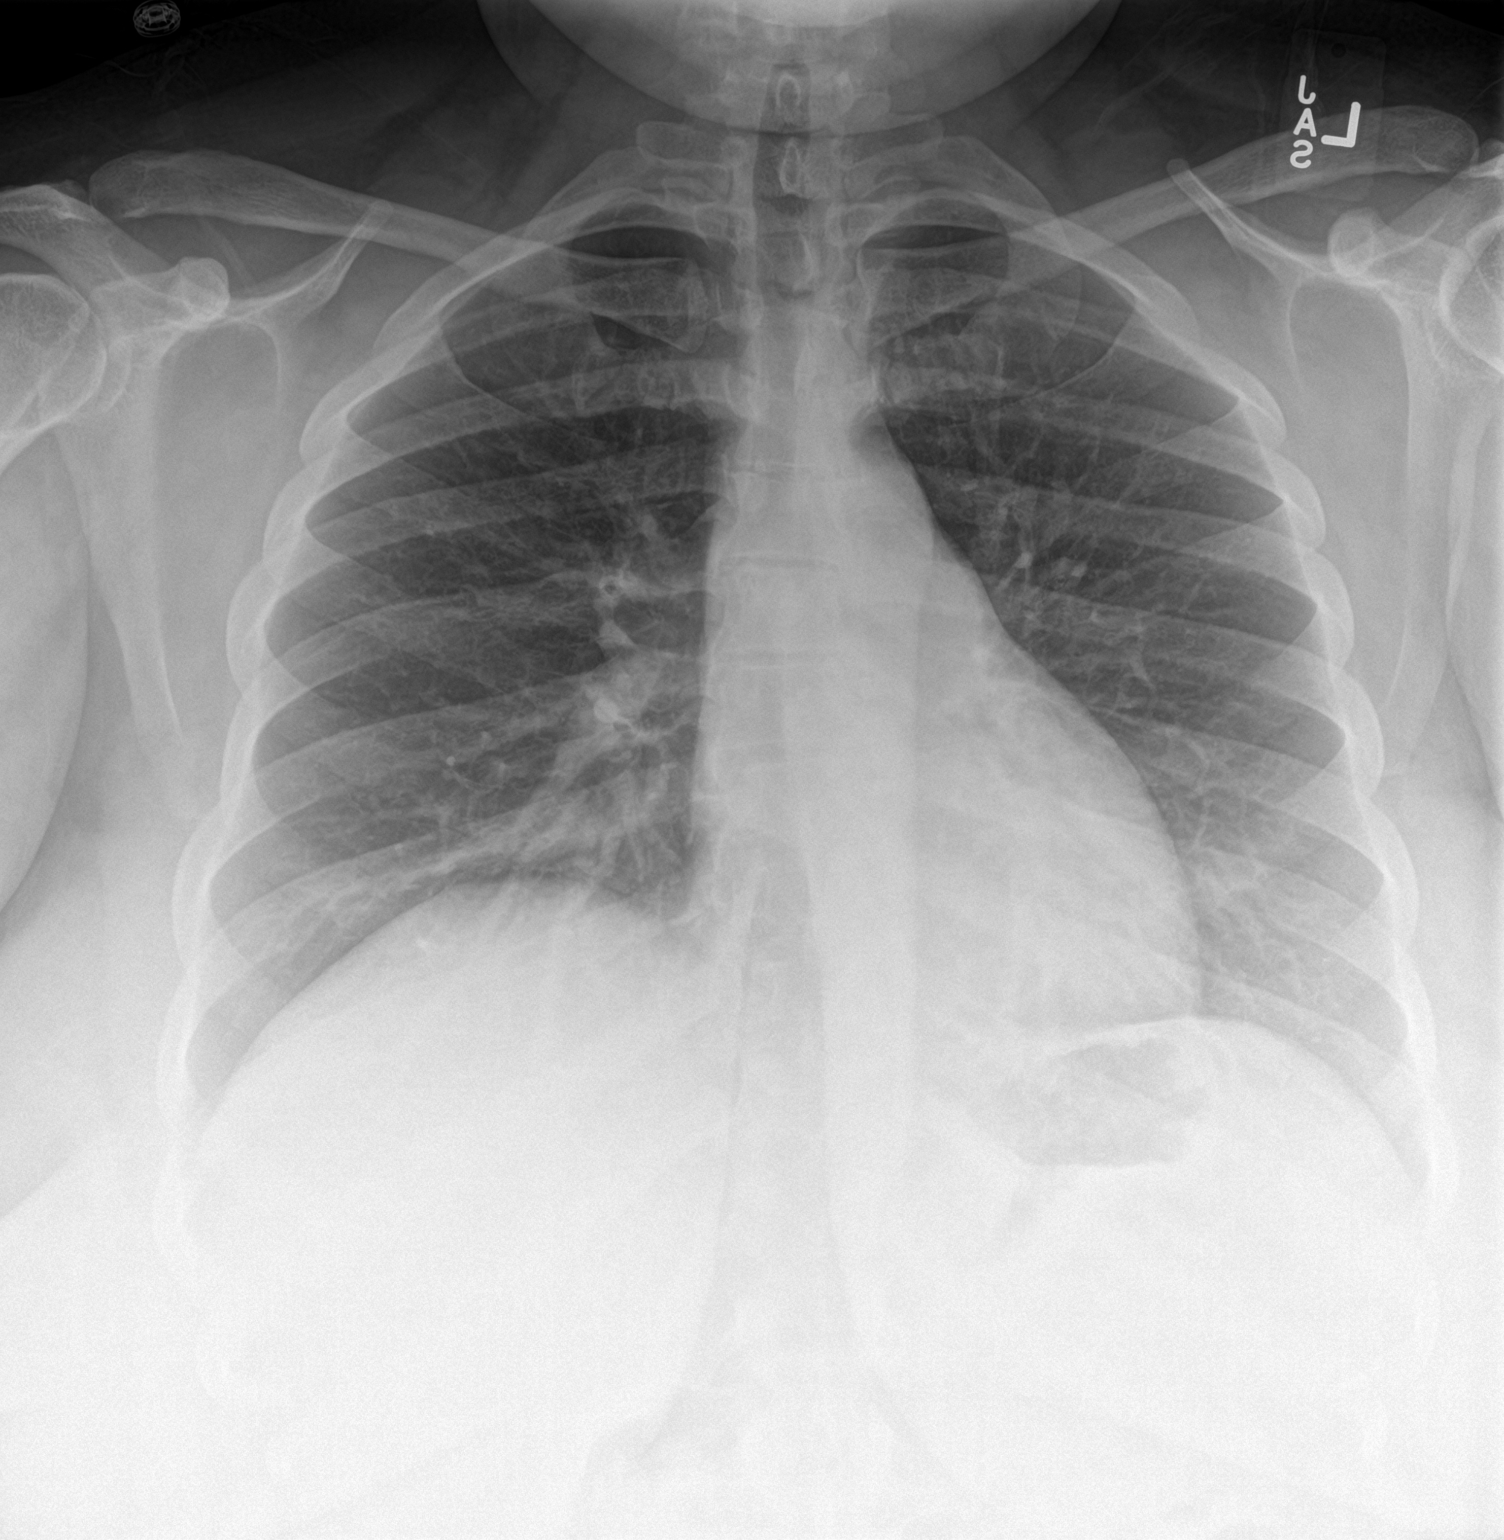

[chest lat]
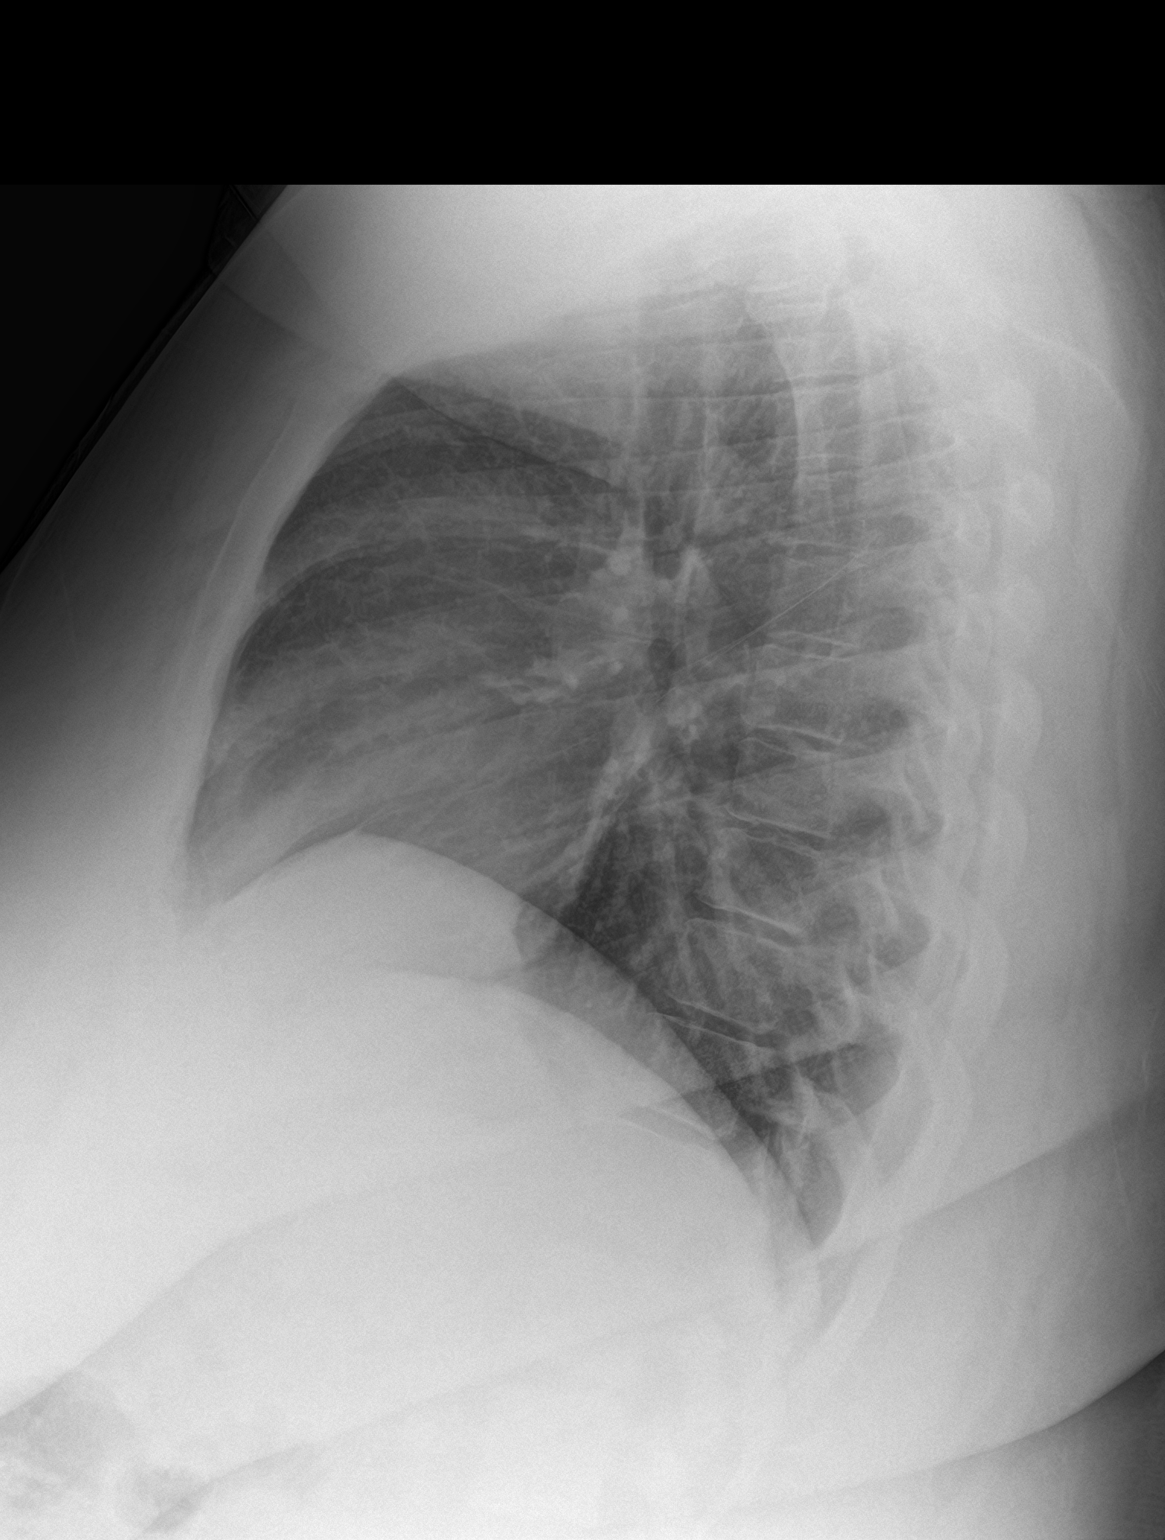

[2 of 2 positions shown; findings below may reference images not displayed]

FINDINGS: The heart size and mediastinal contours are within normal limits.
Both lungs are clear. No pleural effusion or pneumothorax. The
visualized skeletal structures are unremarkable.
IMPRESSION: Normal chest radiographs.

## 2021-11-11 NOTE — H&P (Addendum)
 Labor and Delivery Admission Summary   Name: Glenda Romero Date: 11/11/2021 08:10  MR#: [id number] DOB: 15-Jan-1986  Room #: PGRRC/PGRRC01 Age/Sex: 36 y.o. female  Admit Date: 11/10/2021 Admitting: Marinell Isaiah Smitty Mickey., MD  Acct #:      Admit History Change? yes - CHTN with Superimposed Severe Preeclampsia.  History of Present Illness:   Seen in clinic for the first time 2 days ago. Diagnosed with CHTN. Started on 60 mg procardia bif. Came to L&D and had elevated BP. Despite labetalol, no changes in BP. Labs: Urine protein elevated. Diagnosed with either Severe Preeclampsia or CHTN with Superimposed Severe Preeclampsia. Startyed overnight on magnesium sulfate. Prior CD for twins.  Estimated Gestational Age (EGA) - [redacted]w[redacted]d  Time of ROM: TBD  GBS status - Not done  Herpes: unknown Current Lesion: no   COVID-19 Exposure History: Patient denies that she has been in contact with someone, within their household or other places, who tested positive for COVID19, or who is under investigation for COVID19 .SABRA  Maternal Physical Exam/Assessment:   Date: 11/11/2021  Age: 36 y.o. G: 3 P: 2 A: 0 LC: 3 EDC: 12/06/2021   System: Findings:  HEENT normal  HEART AND LUNGS normal  ABDOMEN normal  EXTREMITIES normal  REFLEXES normal  CERVIX Dil: cl Eff: th Station: hi Presenting Part: vtx FHT: 150 reactive   EFW: 7  Prenatal Labs: Rubella:  IMM Blood Type:  B+ VDRL:  nr HIV:  nr HBsAg:  nr GBS:     Allergies: Patient has no known allergies.  Consent: Discussed the patient's condition, proposed procedure, material risks, benefits, side effects, complications, likelihood of achieving goals, and alternatives including consequences of refusal.  The patient was given the opportunity to ask questions, voices understanding, and has signed the operative permit.  Comments: repeat CD  Bernett Francis Orval PONCE, MD 11/11/2021 08:10

## 2021-11-19 NOTE — ED Provider Notes (Signed)
 [facility]  ROOM#: [id number]    Chief Complaint  Patient presents with  . Post-Op Problem    Pt is a week post c section, here for stiches removal. Pt has a drain    First Provider Contact Date and Time:  11/19/21 1822  History of Present Illness  6:22 PM Glenda Romero is a 36 y.o. female with a h/o HTN who presents to the ED for C-section staple removal. Pt reports her C-section took place on 10/5. Pt reports she has a drain placed. She endorses 8/10 pain along her staples.  Independent history obtained from:   Patient's family Prior records reviewed:  Prior DISCHARGE SUMMARY reviewed.  Previous History   Past Medical History:  Diagnosis Date  . Essential hypertension, benign    Past Surgical History:  Procedure Laterality Date  . CESAREAN DELIVERY ONLY;    . SURGICAL PROCEDURE/COMMENT N/A 11/11/2021   Procedure: CESAREAN SECTION;  Surgeon: Orval Bernett Francis PONCE, MD;  Location: [facility] L&D;  Service: Obstetrics   No family history on file. Social History   Socioeconomic History  . Marital status: Married    Spouse name: Not on file  . Number of children: Not on file  . Years of education: Not on file  . Highest education level: Not on file  Occupational History  . Not on file  Tobacco Use  . Smoking status: Former    Types: Cigarettes    Passive exposure: Past  . Smokeless tobacco: Never  Vaping Use  . Vaping Use: Never used  Substance and Sexual Activity  . Alcohol use: Never  . Drug use: Never  . Sexual activity: Not on file  Other Topics Concern  . Not on file  Social History Narrative  . Not on file   Social Determinants of Health   Financial Resource Strain: Not on file  Food Insecurity: Not on file  Transportation Needs: Not on file  Physical Activity: Not on file  Stress: Not on file  Social Connections: Not on file  Intimate Partner Violence: Not on file  Housing Stability: Not on file      ROS:  Review of Systems Pertinent positive and negative  symptoms listed in HPI.    ED Triage Vitals [11/19/21 1832]  BP (!) 163/99  Pulse 86  Resp 17  Temp 98.6 F (37 C)  Temp src Oral  SpO2 98 %  Pain Level 8  Oxygen Therapy Flow (L/min)   Oxygen Therapy O2 Device   Trauma Activation Level   BMI      Physical Exam:  Physical Exam Vitals and nursing note reviewed.  Constitutional:      Appearance: Normal appearance.  HENT:     Head: Normocephalic and atraumatic.     Nose: Nose normal.     Mouth/Throat:     Mouth: Mucous membranes are moist.  Eyes:     Extraocular Movements: Extraocular movements intact.     Conjunctiva/sclera: Conjunctivae normal.  Cardiovascular:     Rate and Rhythm: Normal rate and regular rhythm.     Pulses: Normal pulses.     Heart sounds: Normal heart sounds.  Pulmonary:     Effort: Pulmonary effort is normal. No respiratory distress.     Breath sounds: Normal breath sounds.  Abdominal:     General: Abdomen is flat. There is no distension.     Palpations: Abdomen is soft.     Tenderness: There is no abdominal tenderness.     Comments: Wound vac and  bandage along C_section scar No peritonitis  Musculoskeletal:        General: No signs of injury. Normal range of motion.     Cervical back: Normal range of motion and neck supple.  Skin:    General: Skin is warm and dry.     Capillary Refill: Capillary refill takes less than 2 seconds.     Findings: No rash.  Neurological:     General: No focal deficit present.     Mental Status: She is alert and oriented to person, place, and time.     Comments: Speech clear, face symmetric, move all extremities equally   :     Procedures:  Procedures  Data  Labs:  Lab Results     Procedure Component Value Ref Range Date/Time   AUTOMATED DIFFERENTIAL [[id number]] Collected: 11/19/21 1936   Specimen: Blood Updated: 11/19/21 2039    Neutrophil % 61.8 %     Imm Grans % 0.4 %     Lymphocyte % 28.7 %     Monocyte % 7.2 %     Eosinophil % 1.7 %     Baso %  0.2 %     Neutrophil # 6.60 1.50 - 7.00 k/uL     Imm Grans abs 0.04 k/uL     Lymphocyte # 3.06 0.85 - 3.20 k/uL     Monoocyte # 0.77 0.20 - 0.80 k/uL     Eosinophil # 0.18 0.00 - 0.50 k/uL     Basophil # 0.02 0.00 - 0.10 k/uL     Differential Type Auto   CBC WITH DIFFERENTIAL [[id number]]  (Abnormal) Collected: 11/19/21 1936   Specimen: Blood Updated: 11/19/21 2039    WBC 10.67* 4.23 - 9.71 k/uL     RBC 3.44* 3.70 - 5.20 M/uL     HGB 9.6* 11.0 - 15.0 g/dL     HCT 68.4* 66.9 - 54.9 %     MCV 91.6 79.0 - 98.0 fL     MCH 27.9 27.0 - 34.0 pg     MCHC 30.5* 32.0 - 36.0 g/dL     Platelet Count 536* 130 - 400 k/uL     RDW - CV 13.9 11.5 - 15.0 %     RDW - SD 46.8 37.0 - 51.0 fL     MPV 8.9* 9.2 - 12.6 fL     NRBC % 0.0 %     nRBC, absolute 0.00 0.00 - 0.00 k/uL    COMPREHENSIVE METABOLIC PANEL [[id number]]  (Abnormal) Collected: 11/19/21 1936   Specimen: Blood Updated: 11/19/21 2053    Glucose 82 70 - 100 mg/dL     BUN 10 7 - 19 mg/dL     Creatinine 9.20 9.44 - 1.11 mg/dL     eGFR 99 >=39 fO/fpw/8.26 m2     BUN/Creat Ratio 12.7 10.0 - 20.0     Sodium 143 135 - 145 mmol/L     Potassium 4.4 3.5 - 5.0 mmol/L     Chloride 109 98 - 110 mmol/L     CO2 26 21 - 29 mmol/L     AGap 8 4 - 12     Total Protein 6.9 6.0 - 8.3 g/dL     Albumin 2.5* 3.3 - 5.0 g/dL     Globulins 4.4* 2.3 - 3.5 g/dL     A/G Ratio 0.6* 1.1 - 1.8 g/dL     Calcium 9.1 8.4 - 89.7 mg/dL     Alk Phos 897* 42 - 98 U/L  Bilirubin, Total 0.2 0.2 - 1.2 mg/dL     AST (SGOT) 10 5 - 34 U/L     ALT (SGPT) 13 0 - 55 U/L     Osmolality calc 294 278 - 301 mOsm/Kg    URIC ACID [[id number]] Collected: 11/19/21 1936   Specimen: Blood Updated: 11/19/21 2053    Uric Acid 5.8 2.6 - 6.0 mg/dL       Radiology: Imaging Results   None     ED Course & Medical Decision Making  Number and Complexity of Problems Differential Diagnosis: Pt is a 37 y.o. female who presents to the ED for staple removal for her C-section scar. Pt  missed her appointment today and they wouldn't see her. Pt is hypertensive. I recommend she goes to L&D as she is still in the post-partum period.  Clinical/Quality Tools  MIPS: No pertinent MIPS measures for this encounter.  Risk tools:                     Medications received in ED:  ED Medication Administration from 11/19/2021 1805 to 11/19/2021 2210     Date/Time Order Dose Route Action   11/19/2021 1949 CDT NIFEdipine XL (Procardia XL) tablet 60 mg ORAL Given   11/19/2021 2200 CDT NIFEdipine XL (Procardia XL) tablet -- ORAL Due       ED Course as of 11/19/21 2218  Fri Nov 19, 2021  1856 Pt re-evaluated. Pt's wound appears to be well-healing. Pt states she routinely takes Lisinopril.    1901 Consult:  Discussed case with Dr. Jacque, OB-GYN, regarding the patient's history, condition, results, and plan of care.  They state that it is okay to leave the staples in and that they can be removed in clinic. They are more concerned about pt's BP so they will try to get pt to L&D for observation.   1908 Dr. Jacque, OB-GYN: [phone]  2113 Consult: Dr. Jacque, OB-GYN, states she would like to admit the pt to [facility].  2143 Pt re-evaluated.  I have discussed results and diagnosis as well as plans for admission. Pt is unsure if she is willing to be admitted even after extensive discussion.  2150 Pt is leaving against my recommendations and medical advice. Pt has been informed that leaving may result in deterioration, death, or permanent disability. Pt voiced understanding of risks of leaving and verbalized that risks may include deterioration, death, or permanent disability. The pt verbally acknowledges that she is assuming these risks. The pt displays the appropriate capacity to make this decision. Pt has also been made aware that our Emergency Department is always open, and that they can return to the ED at any time for continued treatment.     Critical Care:  None  Social  Determinants of Health that impact treatment or disposition:  None      11/19/21 1832 11/19/21 2100  BP: (!) 163/99 151/84  Pulse: 86 94  Resp: 17 18  Temp: 98.6 F (37 C)   SpO2: 98% 100%    Disposition   ED Disposition     ED Disposition  Left Against Medical Advice   Condition  --   Comment  The condition of the patient at this time is: stable          There are no referrals from this visit  Discharge Medication List as of 11/19/2021 10:10 PM     START taking these medications   Details  NIFEdipine XL (Procardia XL) 60 mg  tablet Take 1 Tablet (60 mg total) by mouth two(2) times daily, Disp-60 Tablet, R-0, ePrescribe       Discharge Medication List as of 11/19/2021 10:10 PM        Encounter Diagnoses ED Diagnosis     Diagnosis Comment Added By Time Added     Final diagnosis   Hypertension -- [place] Rockey Lin, MD 11/19/2021 2132        Scribe:  Aditi Chiruvanuru  I, Aditi Chiruvanuru, am scribing for and in the presence of Vannguyen, Rockey Lin, MD.  Documentation assistance provided for Rockey [place], MD, by the following scribe(s) Matilda Aline. Information recorded by the Scribe was done at my direction and has been reviewed and validated by me and electronically signed by Rockey [place], MD on 11/19/2021, 11:05 PM.   Electronically signed by: Rockey MICAEL [place], MD Emergency Medicine Physician [employer] [phone]         *Some images could not be shown.

## 2022-06-28 ENCOUNTER — Ambulatory Visit: Payer: Medicaid Other

## 2023-11-22 ENCOUNTER — Inpatient Hospital Stay (HOSPITAL_COMMUNITY)
Admission: AD | Admit: 2023-11-22 | Discharge: 2023-11-28 | DRG: 786 | Disposition: A | Attending: Obstetrics and Gynecology | Admitting: Obstetrics and Gynecology

## 2023-11-22 ENCOUNTER — Inpatient Hospital Stay (HOSPITAL_COMMUNITY): Admitting: Anesthesiology

## 2023-11-22 ENCOUNTER — Other Ambulatory Visit: Payer: Self-pay

## 2023-11-22 ENCOUNTER — Inpatient Hospital Stay (HOSPITAL_COMMUNITY)

## 2023-11-22 ENCOUNTER — Encounter (HOSPITAL_COMMUNITY): Payer: Self-pay | Admitting: Obstetrics and Gynecology

## 2023-11-22 ENCOUNTER — Encounter (HOSPITAL_COMMUNITY)
Admission: AD | Disposition: A | Discharge status: Home / Self Care | Payer: Self-pay | Source: Home / Self Care | Attending: Obstetrics and Gynecology

## 2023-11-22 DIAGNOSIS — D62 Acute posthemorrhagic anemia: Secondary | ICD-10-CM | POA: Diagnosis not present

## 2023-11-22 DIAGNOSIS — Z98891 History of uterine scar from previous surgery: Secondary | ICD-10-CM

## 2023-11-22 DIAGNOSIS — O321XX2 Maternal care for breech presentation, fetus 2: Secondary | ICD-10-CM | POA: Diagnosis present

## 2023-11-22 DIAGNOSIS — O1092 Unspecified pre-existing hypertension complicating childbirth: Secondary | ICD-10-CM | POA: Diagnosis present

## 2023-11-22 DIAGNOSIS — R109 Unspecified abdominal pain: Secondary | ICD-10-CM

## 2023-11-22 DIAGNOSIS — O4593 Premature separation of placenta, unspecified, third trimester: Principal | ICD-10-CM | POA: Diagnosis present

## 2023-11-22 DIAGNOSIS — N179 Acute kidney failure, unspecified: Secondary | ICD-10-CM | POA: Diagnosis present

## 2023-11-22 DIAGNOSIS — M7989 Other specified soft tissue disorders: Secondary | ICD-10-CM | POA: Diagnosis not present

## 2023-11-22 DIAGNOSIS — E875 Hyperkalemia: Secondary | ICD-10-CM | POA: Diagnosis not present

## 2023-11-22 DIAGNOSIS — O34218 Maternal care for other type scar from previous cesarean delivery: Secondary | ICD-10-CM | POA: Diagnosis present

## 2023-11-22 DIAGNOSIS — O30043 Twin pregnancy, dichorionic/diamniotic, third trimester: Secondary | ICD-10-CM | POA: Diagnosis present

## 2023-11-22 DIAGNOSIS — O99214 Obesity complicating childbirth: Secondary | ICD-10-CM | POA: Diagnosis not present

## 2023-11-22 DIAGNOSIS — O0932 Supervision of pregnancy with insufficient antenatal care, second trimester: Secondary | ICD-10-CM

## 2023-11-22 DIAGNOSIS — O99892 Other specified diseases and conditions complicating childbirth: Secondary | ICD-10-CM | POA: Diagnosis not present

## 2023-11-22 DIAGNOSIS — O34219 Maternal care for unspecified type scar from previous cesarean delivery: Secondary | ICD-10-CM | POA: Diagnosis not present

## 2023-11-22 DIAGNOSIS — Z6841 Body Mass Index (BMI) 40.0 and over, adult: Secondary | ICD-10-CM

## 2023-11-22 DIAGNOSIS — O0933 Supervision of pregnancy with insufficient antenatal care, third trimester: Secondary | ICD-10-CM

## 2023-11-22 DIAGNOSIS — O4592 Premature separation of placenta, unspecified, second trimester: Secondary | ICD-10-CM | POA: Diagnosis not present

## 2023-11-22 DIAGNOSIS — O9081 Anemia of the puerperium: Secondary | ICD-10-CM | POA: Diagnosis not present

## 2023-11-22 DIAGNOSIS — O114 Pre-existing hypertension with pre-eclampsia, complicating childbirth: Secondary | ICD-10-CM | POA: Diagnosis not present

## 2023-11-22 DIAGNOSIS — O871 Deep phlebothrombosis in the puerperium: Secondary | ICD-10-CM | POA: Diagnosis not present

## 2023-11-22 DIAGNOSIS — I82532 Chronic embolism and thrombosis of left popliteal vein: Secondary | ICD-10-CM | POA: Diagnosis not present

## 2023-11-22 DIAGNOSIS — O26893 Other specified pregnancy related conditions, third trimester: Secondary | ICD-10-CM | POA: Diagnosis not present

## 2023-11-22 DIAGNOSIS — Z3490 Encounter for supervision of normal pregnancy, unspecified, unspecified trimester: Secondary | ICD-10-CM

## 2023-11-22 DIAGNOSIS — O99893 Other specified diseases and conditions complicating puerperium: Secondary | ICD-10-CM

## 2023-11-22 DIAGNOSIS — O34211 Maternal care for low transverse scar from previous cesarean delivery: Secondary | ICD-10-CM | POA: Diagnosis not present

## 2023-11-22 DIAGNOSIS — O99284 Endocrine, nutritional and metabolic diseases complicating childbirth: Secondary | ICD-10-CM | POA: Diagnosis present

## 2023-11-22 DIAGNOSIS — Z3A28 28 weeks gestation of pregnancy: Secondary | ICD-10-CM | POA: Diagnosis not present

## 2023-11-22 DIAGNOSIS — O30003 Twin pregnancy, unspecified number of placenta and unspecified number of amniotic sacs, third trimester: Secondary | ICD-10-CM | POA: Diagnosis not present

## 2023-11-22 DIAGNOSIS — Z3A27 27 weeks gestation of pregnancy: Secondary | ICD-10-CM | POA: Diagnosis not present

## 2023-11-22 DIAGNOSIS — Z3A36 36 weeks gestation of pregnancy: Secondary | ICD-10-CM | POA: Diagnosis not present

## 2023-11-22 DIAGNOSIS — Z3A26 26 weeks gestation of pregnancy: Secondary | ICD-10-CM | POA: Diagnosis not present

## 2023-11-22 DIAGNOSIS — R0683 Snoring: Secondary | ICD-10-CM | POA: Diagnosis present

## 2023-11-22 DIAGNOSIS — Z79899 Other long term (current) drug therapy: Secondary | ICD-10-CM | POA: Diagnosis not present

## 2023-11-22 DIAGNOSIS — O1414 Severe pre-eclampsia complicating childbirth: Secondary | ICD-10-CM | POA: Diagnosis not present

## 2023-11-22 DIAGNOSIS — O10919 Unspecified pre-existing hypertension complicating pregnancy, unspecified trimester: Secondary | ICD-10-CM | POA: Diagnosis present

## 2023-11-22 DIAGNOSIS — Z3689 Encounter for other specified antenatal screening: Secondary | ICD-10-CM

## 2023-11-22 DIAGNOSIS — Z86718 Personal history of other venous thrombosis and embolism: Secondary | ICD-10-CM

## 2023-11-22 HISTORY — DX: Gestational (pregnancy-induced) hypertension without significant proteinuria, unspecified trimester: O13.9

## 2023-11-22 LAB — DIFFERENTIAL
Abs Immature Granulocytes: 0.09 K/uL — ABNORMAL HIGH (ref 0.00–0.07)
Basophils Absolute: 0 K/uL (ref 0.0–0.1)
Basophils Relative: 0 %
Eosinophils Absolute: 0 K/uL (ref 0.0–0.5)
Eosinophils Relative: 0 %
Immature Granulocytes: 1 %
Lymphocytes Relative: 14 %
Lymphs Abs: 2.7 K/uL (ref 0.7–4.0)
Monocytes Absolute: 1.1 K/uL — ABNORMAL HIGH (ref 0.1–1.0)
Monocytes Relative: 6 %
Neutro Abs: 15 K/uL — ABNORMAL HIGH (ref 1.7–7.7)
Neutrophils Relative %: 79 %

## 2023-11-22 LAB — CBC
HCT: 27.5 % — ABNORMAL LOW (ref 36.0–46.0)
HCT: 26.6 % — ABNORMAL LOW (ref 36.0–46.0)
HCT: 27.4 % — ABNORMAL LOW (ref 36.0–46.0)
Hemoglobin: 8.1 g/dL — ABNORMAL LOW (ref 12.0–15.0)
Hemoglobin: 8.2 g/dL — ABNORMAL LOW (ref 12.0–15.0)
Hemoglobin: 8.6 g/dL — ABNORMAL LOW (ref 12.0–15.0)
MCH: 23 pg — ABNORMAL LOW (ref 26.0–34.0)
MCH: 24.8 pg — ABNORMAL LOW (ref 26.0–34.0)
MCH: 25.1 pg — ABNORMAL LOW (ref 26.0–34.0)
MCHC: 29.5 g/dL — ABNORMAL LOW (ref 30.0–36.0)
MCHC: 30.8 g/dL (ref 30.0–36.0)
MCHC: 31.4 g/dL (ref 30.0–36.0)
MCV: 78.1 fL — ABNORMAL LOW (ref 80.0–100.0)
MCV: 80.6 fL (ref 80.0–100.0)
MCV: 80.1 fL (ref 80.0–100.0)
Platelets: 294 K/uL (ref 150–400)
Platelets: 147 K/uL — ABNORMAL LOW (ref 150–400)
Platelets: 136 10*3/uL — ABNORMAL LOW (ref 150–400)
RBC: 3.52 MIL/uL — ABNORMAL LOW (ref 3.87–5.11)
RBC: 3.3 MIL/uL — ABNORMAL LOW (ref 3.87–5.11)
RBC: 3.42 MIL/uL — ABNORMAL LOW (ref 3.87–5.11)
RDW: 18.9 % — ABNORMAL HIGH (ref 11.5–15.5)
RDW: 18.1 % — ABNORMAL HIGH (ref 11.5–15.5)
RDW: 18.1 % — ABNORMAL HIGH (ref 11.5–15.5)
WBC: 18.9 K/uL — ABNORMAL HIGH (ref 4.0–10.5)
WBC: 19.5 K/uL — ABNORMAL HIGH (ref 4.0–10.5)
WBC: 25.6 10*3/uL — ABNORMAL HIGH (ref 4.0–10.5)
nRBC: 0 % (ref 0.0–0.2)
nRBC: 0 % (ref 0.0–0.2)
nRBC: 0 % (ref 0.0–0.2)

## 2023-11-22 LAB — PROTEIN / CREATININE RATIO, URINE
Creatinine, Urine: 130 mg/dL
Total Protein, Urine: >600 mg/dL

## 2023-11-22 LAB — COMPREHENSIVE METABOLIC PANEL WITH GFR
ALT: 10 U/L (ref 0–44)
ALT: 9 U/L (ref 0–44)
ALT: 11 U/L (ref 0–44)
AST: 24 U/L (ref 15–41)
AST: 24 U/L (ref 15–41)
AST: 33 U/L (ref 15–41)
Albumin: 1.9 g/dL — ABNORMAL LOW (ref 3.5–5.0)
Albumin: 1.7 g/dL — ABNORMAL LOW (ref 3.5–5.0)
Albumin: 1.7 g/dL — ABNORMAL LOW (ref 3.5–5.0)
Alkaline Phosphatase: 113 U/L (ref 38–126)
Alkaline Phosphatase: 96 U/L (ref 38–126)
Alkaline Phosphatase: 89 U/L (ref 38–126)
Anion gap: 14 (ref 5–15)
Anion gap: 10 (ref 5–15)
Anion gap: 11 (ref 5–15)
BUN: 17 mg/dL (ref 6–20)
BUN: 17 mg/dL (ref 6–20)
BUN: 20 mg/dL (ref 6–20)
CO2: 16 mmol/L — ABNORMAL LOW (ref 22–32)
CO2: 21 mmol/L — ABNORMAL LOW (ref 22–32)
CO2: 19 mmol/L — ABNORMAL LOW (ref 22–32)
Calcium: 8.1 mg/dL — ABNORMAL LOW (ref 8.9–10.3)
Calcium: 7.6 mg/dL — ABNORMAL LOW (ref 8.9–10.3)
Calcium: 7.7 mg/dL — ABNORMAL LOW (ref 8.9–10.3)
Chloride: 106 mmol/L (ref 98–111)
Chloride: 104 mmol/L (ref 98–111)
Chloride: 103 mmol/L (ref 98–111)
Creatinine, Ser: 1.89 mg/dL — ABNORMAL HIGH (ref 0.44–1.00)
Creatinine, Ser: 2.02 mg/dL — ABNORMAL HIGH (ref 0.44–1.00)
Creatinine, Ser: 2.43 mg/dL — ABNORMAL HIGH (ref 0.44–1.00)
GFR, Estimated: 34 mL/min — ABNORMAL LOW (ref >60)
GFR, Estimated: 32 mL/min — ABNORMAL LOW (ref >60)
GFR, Estimated: 25 mL/min — ABNORMAL LOW (ref >60)
Glucose, Bld: 109 mg/dL — ABNORMAL HIGH (ref 70–99)
Glucose, Bld: 122 mg/dL — ABNORMAL HIGH (ref 70–99)
Glucose, Bld: 124 mg/dL — ABNORMAL HIGH (ref 70–99)
Potassium: 4.1 mmol/L (ref 3.5–5.1)
Potassium: 5.1 mmol/L (ref 3.5–5.1)
Potassium: 5.4 mmol/L — ABNORMAL HIGH (ref 3.5–5.1)
Sodium: 136 mmol/L (ref 135–145)
Sodium: 135 mmol/L (ref 135–145)
Sodium: 133 mmol/L — ABNORMAL LOW (ref 135–145)
Total Bilirubin: 1 mg/dL (ref 0.0–1.2)
Total Bilirubin: 0.6 mg/dL (ref 0.0–1.2)
Total Bilirubin: 0.7 mg/dL (ref 0.0–1.2)
Total Protein: 5.8 g/dL — ABNORMAL LOW (ref 6.5–8.1)
Total Protein: 4.9 g/dL — ABNORMAL LOW (ref 6.5–8.1)
Total Protein: 5 g/dL — ABNORMAL LOW (ref 6.5–8.1)

## 2023-11-22 LAB — DIC (DISSEMINATED INTRAVASCULAR COAGULATION)PANEL
D-Dimer, Quant: >20 ug{FEU}/mL — ABNORMAL HIGH (ref 0.00–0.50)
Fibrinogen: 417 mg/dL (ref 210–475)
INR: 1 (ref 0.8–1.2)
Platelets: 250 K/uL (ref 150–400)
Prothrombin Time: 13.9 s (ref 11.4–15.2)
Smear Review: NONE SEEN
aPTT: 25 s (ref 24–36)

## 2023-11-22 LAB — HEMOGLOBIN A1C
Hgb A1c MFr Bld: 5.3 % (ref 4.8–5.6)
Mean Plasma Glucose: 105.41 mg/dL

## 2023-11-22 LAB — KLEIHAUER-BETKE STAIN
Fetal Cells %: 0 %
Quantitation Fetal Hemoglobin: 0 mL

## 2023-11-22 LAB — MAGNESIUM
Magnesium: 3.9 mg/dL — ABNORMAL HIGH (ref 1.7–2.4)
Magnesium: 4.6 mg/dL — ABNORMAL HIGH (ref 1.7–2.4)

## 2023-11-22 LAB — PREPARE RBC (CROSSMATCH)

## 2023-11-22 LAB — ABO/RH: ABO/RH(D): B POS

## 2023-11-22 LAB — HEPATITIS B SURFACE ANTIGEN: Hepatitis B Surface Ag: NONREACTIVE

## 2023-11-22 LAB — HIV ANTIBODY (ROUTINE TESTING W REFLEX): HIV Screen 4th Generation wRfx: NONREACTIVE

## 2023-11-22 SURGERY — Surgical Case
Anesthesia: Spinal | Site: Abdomen

## 2023-11-22 MED ORDER — LABETALOL HCL 5 MG/ML IV SOLN: 40.0000 mg | INTRAVENOUS | Status: DC | PRN

## 2023-11-22 MED ORDER — ONDANSETRON HCL 4 MG/2ML IJ SOLN
INTRAMUSCULAR | Status: DC | PRN
  Administered 2023-11-22: 4 mg via INTRAVENOUS

## 2023-11-22 MED ORDER — MAGNESIUM SULFATE 40 GM/1000ML IV SOLN
INTRAVENOUS | Status: AC
  Administered 2023-11-22: 4 g via INTRAVENOUS
  Filled 2023-11-22: qty 1000

## 2023-11-22 MED ORDER — DEXAMETHASONE SOD PHOSPHATE PF 10 MG/ML IJ SOLN
INTRAMUSCULAR | Status: DC | PRN
  Administered 2023-11-22 (×2): 5 mg via INTRAVENOUS

## 2023-11-22 MED ORDER — CEFAZOLIN SODIUM-DEXTROSE 3-4 GM/150ML-% IV SOLN
INTRAVENOUS | Status: AC
  Filled 2023-11-22: qty 150

## 2023-11-22 MED ORDER — HYDRALAZINE HCL 20 MG/ML IJ SOLN
5.0000 mg | Freq: Once | INTRAMUSCULAR | Status: AC
  Administered 2023-11-22: 5 mg via INTRAVENOUS

## 2023-11-22 MED ORDER — LACTATED RINGERS IV SOLN: INTRAVENOUS | Status: DC

## 2023-11-22 MED ORDER — ACETAMINOPHEN 500 MG PO TABS
1000.0000 mg | ORAL_TABLET | Freq: Four times a day (QID) | ORAL | Status: AC
  Administered 2023-11-22 – 2023-11-23 (×4): 1000 mg via ORAL
  Filled 2023-11-22 (×4): qty 2

## 2023-11-22 MED ORDER — PHENYLEPHRINE HCL-NACL 20-0.9 MG/250ML-% IV SOLN
INTRAVENOUS | Status: DC | PRN
  Administered 2023-11-22: 20 ug/min via INTRAVENOUS

## 2023-11-22 MED ORDER — OXYCODONE HCL 5 MG PO TABS: 5.0000 mg | ORAL_TABLET | Freq: Once | ORAL | Status: DC | PRN

## 2023-11-22 MED ORDER — NALOXONE HCL 0.4 MG/ML IJ SOLN: 0.4000 mg | INTRAMUSCULAR | Status: DC | PRN

## 2023-11-22 MED ORDER — SODIUM CHLORIDE 0.9 % IV BOLUS
500.0000 mL | Freq: Once | INTRAVENOUS | Status: AC
  Administered 2023-11-22: 500 mL via INTRAVENOUS

## 2023-11-22 MED ORDER — LACTATED RINGERS IV SOLN: INTRAVENOUS | Status: DC | PRN

## 2023-11-22 MED ORDER — SOD CITRATE-CITRIC ACID 500-334 MG/5ML PO SOLN: 30.0000 mL | ORAL | Status: AC

## 2023-11-22 MED ORDER — LIDOCAINE-EPINEPHRINE (PF) 2 %-1:200000 IJ SOLN
INTRAMUSCULAR | Status: AC
  Filled 2023-11-22: qty 20

## 2023-11-22 MED ORDER — HYDRALAZINE HCL 20 MG/ML IJ SOLN: 10.0000 mg | INTRAMUSCULAR | Status: DC | PRN

## 2023-11-22 MED ORDER — FENTANYL CITRATE (PF) 100 MCG/2ML IJ SOLN
25.0000 ug | INTRAMUSCULAR | Status: DC | PRN
  Administered 2023-11-22: 50 ug via INTRAVENOUS

## 2023-11-22 MED ORDER — SODIUM ZIRCONIUM CYCLOSILICATE 10 G PO PACK
10.0000 g | PACK | Freq: Once | ORAL | Status: AC
  Administered 2023-11-23: 10 g via ORAL
  Filled 2023-11-22: qty 1

## 2023-11-22 MED ORDER — HYDROMORPHONE HCL 2 MG PO TABS
2.0000 mg | ORAL_TABLET | Freq: Four times a day (QID) | ORAL | Status: DC | PRN
  Administered 2023-11-22 – 2023-11-23 (×2): 4 mg via ORAL
  Filled 2023-11-22 (×2): qty 2

## 2023-11-22 MED ORDER — DIPHENHYDRAMINE HCL 25 MG PO CAPS: 25.0000 mg | ORAL_CAPSULE | ORAL | Status: DC | PRN

## 2023-11-22 MED ORDER — WITCH HAZEL-GLYCERIN EX PADS: 1.0000 | MEDICATED_PAD | CUTANEOUS | Status: DC | PRN

## 2023-11-22 MED ORDER — MORPHINE SULFATE (PF) 0.5 MG/ML IJ SOLN
INTRAMUSCULAR | Status: DC | PRN
  Administered 2023-11-22: 150 ug via INTRATHECAL

## 2023-11-22 MED ORDER — LACTATED RINGERS IV SOLN
INTRAVENOUS | Status: DC | PRN
Start: 2023-11-22 — End: 2023-11-22

## 2023-11-22 MED ORDER — OXYTOCIN-SODIUM CHLORIDE 30-0.9 UT/500ML-% IV SOLN
INTRAVENOUS | Status: AC
  Filled 2023-11-22: qty 500

## 2023-11-22 MED ORDER — SODIUM CHLORIDE 0.9 % IV SOLN
INTRAVENOUS | Status: DC | PRN
  Administered 2023-11-22 (×2): 500 mg via INTRAVENOUS

## 2023-11-22 MED ORDER — LABETALOL HCL 5 MG/ML IV SOLN
20.0000 mg | INTRAVENOUS | Status: DC | PRN
  Administered 2023-11-22: 20 mg via INTRAVENOUS
  Filled 2023-11-22: qty 4

## 2023-11-22 MED ORDER — DEXMEDETOMIDINE HCL IN NACL 80 MCG/20ML IV SOLN
INTRAVENOUS | Status: AC
  Filled 2023-11-22: qty 20

## 2023-11-22 MED ORDER — PRENATAL MULTIVITAMIN CH
1.0000 | ORAL_TABLET | Freq: Every day | ORAL | Status: DC
  Administered 2023-11-23 – 2023-11-27 (×4): 1 via ORAL
  Filled 2023-11-22 (×5): qty 1

## 2023-11-22 MED ORDER — NALOXONE HCL 4 MG/10ML IJ SOLN: 1.0000 ug/kg/h | INTRAVENOUS | Status: DC | PRN

## 2023-11-22 MED ORDER — SODIUM CHLORIDE 0.9% IV SOLUTION: Freq: Once | INTRAVENOUS | Status: AC

## 2023-11-22 MED ORDER — SODIUM CHLORIDE 0.9% FLUSH: 3.0000 mL | INTRAVENOUS | Status: DC | PRN

## 2023-11-22 MED ORDER — DEXMEDETOMIDINE HCL IN NACL 80 MCG/20ML IV SOLN
INTRAVENOUS | Status: DC | PRN
  Administered 2023-11-22 (×2): 8 ug via INTRAVENOUS
  Administered 2023-11-22: 4 ug via INTRAVENOUS

## 2023-11-22 MED ORDER — LACTATED RINGERS IV SOLN: INTRAVENOUS | Status: AC

## 2023-11-22 MED ORDER — OXYCODONE HCL 5 MG/5ML PO SOLN: 5.0000 mg | Freq: Once | ORAL | Status: DC | PRN

## 2023-11-22 MED ORDER — SIMETHICONE 80 MG PO CHEW: 80.0000 mg | CHEWABLE_TABLET | ORAL | Status: DC | PRN

## 2023-11-22 MED ORDER — TRANEXAMIC ACID-NACL 1000-0.7 MG/100ML-% IV SOLN
INTRAVENOUS | Status: AC
  Filled 2023-11-22: qty 100

## 2023-11-22 MED ORDER — LIDOCAINE-EPINEPHRINE (PF) 1.5 %-1:200000 IJ SOLN
INTRAMUSCULAR | Status: DC | PRN
  Administered 2023-11-22: 3 mL via EPIDURAL

## 2023-11-22 MED ORDER — LABETALOL HCL 5 MG/ML IV SOLN: 80.0000 mg | INTRAVENOUS | Status: DC | PRN

## 2023-11-22 MED ORDER — DIPHENHYDRAMINE HCL 25 MG PO CAPS: 25.0000 mg | ORAL_CAPSULE | Freq: Four times a day (QID) | ORAL | Status: DC | PRN

## 2023-11-22 MED ORDER — CEFAZOLIN SODIUM-DEXTROSE 2-4 GM/100ML-% IV SOLN
2.0000 g | Freq: Two times a day (BID) | INTRAVENOUS | Status: AC
  Administered 2023-11-23: 2 g via INTRAVENOUS
  Filled 2023-11-22: qty 100

## 2023-11-22 MED ORDER — CEFAZOLIN SODIUM-DEXTROSE 3-4 GM/150ML-% IV SOLN
3.0000 g | Freq: Three times a day (TID) | INTRAVENOUS | Status: DC
Start: 2023-11-22 — End: 2023-11-22
  Administered 2023-11-22: 3 g via INTRAVENOUS
  Filled 2023-11-22 (×2): qty 150

## 2023-11-22 MED ORDER — GABAPENTIN 300 MG PO CAPS
300.0000 mg | ORAL_CAPSULE | Freq: Two times a day (BID) | ORAL | Status: DC
  Administered 2023-11-23 (×2): 300 mg via ORAL
  Filled 2023-11-22 (×2): qty 1

## 2023-11-22 MED ORDER — ACETAMINOPHEN 10 MG/ML IV SOLN
INTRAVENOUS | Status: AC
  Filled 2023-11-22: qty 100

## 2023-11-22 MED ORDER — MENTHOL 3 MG MT LOZG: 1.0000 | LOZENGE | OROMUCOSAL | Status: DC | PRN

## 2023-11-22 MED ORDER — DIBUCAINE (PERIANAL) 1 % EX OINT: 1.0000 | TOPICAL_OINTMENT | CUTANEOUS | Status: DC | PRN

## 2023-11-22 MED ORDER — LABETALOL HCL 5 MG/ML IV SOLN
INTRAVENOUS | Status: AC
  Filled 2023-11-22: qty 4

## 2023-11-22 MED ORDER — DROPERIDOL 2.5 MG/ML IJ SOLN: 0.6250 mg | Freq: Once | INTRAMUSCULAR | Status: DC | PRN

## 2023-11-22 MED ORDER — MAGNESIUM SULFATE BOLUS VIA INFUSION
4.0000 g | Freq: Once | INTRAVENOUS | Status: DC
  Filled 2023-11-22: qty 1000

## 2023-11-22 MED ORDER — SODIUM CHLORIDE 0.9 % IR SOLN
Status: DC | PRN
  Administered 2023-11-22: 1

## 2023-11-22 MED ORDER — HYDRALAZINE HCL 20 MG/ML IJ SOLN
INTRAMUSCULAR | Status: AC
  Filled 2023-11-22: qty 1

## 2023-11-22 MED ORDER — PHENYLEPHRINE HCL-NACL 20-0.9 MG/250ML-% IV SOLN
INTRAVENOUS | Status: AC
  Filled 2023-11-22: qty 250

## 2023-11-22 MED ORDER — BUPIVACAINE IN DEXTROSE 0.75-8.25 % IT SOLN
INTRATHECAL | Status: DC | PRN
  Administered 2023-11-22: 1.6 mL via INTRATHECAL

## 2023-11-22 MED ORDER — SODIUM CHLORIDE 0.9 % IV SOLN
INTRAVENOUS | Status: AC
  Filled 2023-11-22: qty 5

## 2023-11-22 MED ORDER — FENTANYL CITRATE (PF) 100 MCG/2ML IJ SOLN
INTRAMUSCULAR | Status: AC
  Filled 2023-11-22: qty 2

## 2023-11-22 MED ORDER — LIDOCAINE-EPINEPHRINE (PF) 2 %-1:200000 IJ SOLN
INTRAMUSCULAR | Status: DC | PRN
  Administered 2023-11-22: 2 mL via EPIDURAL
  Administered 2023-11-22: 5 mL via EPIDURAL
  Administered 2023-11-22: 1 mL via EPIDURAL
  Administered 2023-11-22: 2 mL via EPIDURAL
  Administered 2023-11-22: 5 mL via EPIDURAL

## 2023-11-22 MED ORDER — MAGNESIUM SULFATE IN D5W 1-5 GM/100ML-% IV SOLN: 1.0000 g | Freq: Once | INTRAVENOUS | Status: DC

## 2023-11-22 MED ORDER — DIPHENHYDRAMINE HCL 50 MG/ML IJ SOLN: 12.5000 mg | INTRAMUSCULAR | Status: DC | PRN

## 2023-11-22 MED ORDER — OXYTOCIN-SODIUM CHLORIDE 30-0.9 UT/500ML-% IV SOLN: 2.5000 [IU]/h | INTRAVENOUS | Status: AC

## 2023-11-22 MED ORDER — MIDAZOLAM HCL 2 MG/2ML IJ SOLN
INTRAMUSCULAR | Status: DC | PRN
  Administered 2023-11-22 (×2): 1 mg via INTRAVENOUS

## 2023-11-22 MED ORDER — MAGNESIUM SULFATE 40 GM/1000ML IV SOLN
1.0000 g/h | INTRAVENOUS | Status: DC
  Administered 2023-11-22: 2 g/h via INTRAVENOUS

## 2023-11-22 MED ORDER — MAGNESIUM SULFATE 40 GM/1000ML IV SOLN: 2.0000 g/h | INTRAVENOUS | Status: DC

## 2023-11-22 MED ORDER — ACETAMINOPHEN 10 MG/ML IV SOLN
INTRAVENOUS | Status: DC | PRN
  Administered 2023-11-22: 1000 mg via INTRAVENOUS

## 2023-11-22 MED ORDER — LACTATED RINGERS IV BOLUS
500.0000 mL | Freq: Once | INTRAVENOUS | Status: AC
  Administered 2023-11-22: 500 mL via INTRAVENOUS

## 2023-11-22 MED ORDER — SIMETHICONE 80 MG PO CHEW
80.0000 mg | CHEWABLE_TABLET | Freq: Three times a day (TID) | ORAL | Status: DC
  Administered 2023-11-22 – 2023-11-28 (×16): 80 mg via ORAL
  Filled 2023-11-22 (×16): qty 1

## 2023-11-22 MED ORDER — TRANEXAMIC ACID-NACL 1000-0.7 MG/100ML-% IV SOLN
1000.0000 mg | Freq: Once | INTRAVENOUS | Status: AC
  Administered 2023-11-22: 1000 mg via INTRAVENOUS

## 2023-11-22 MED ORDER — MORPHINE SULFATE (PF) 0.5 MG/ML IJ SOLN
INTRAMUSCULAR | Status: AC
  Filled 2023-11-22: qty 10

## 2023-11-22 MED ORDER — SODIUM CHLORIDE 0.9 % IV SOLN
500.0000 mg | Freq: Once | INTRAVENOUS | Status: DC
  Filled 2023-11-22: qty 5

## 2023-11-22 MED ORDER — MIDAZOLAM HCL 2 MG/2ML IJ SOLN
INTRAMUSCULAR | Status: AC
  Filled 2023-11-22: qty 2

## 2023-11-22 MED ORDER — STERILE WATER FOR IRRIGATION IR SOLN
Status: DC | PRN
  Administered 2023-11-22: 1000 mL

## 2023-11-22 MED ORDER — ONDANSETRON HCL 4 MG/2ML IJ SOLN
4.0000 mg | Freq: Three times a day (TID) | INTRAMUSCULAR | Status: DC | PRN
Start: 2023-11-22 — End: 2023-11-28
  Administered 2023-11-23: 4 mg via INTRAVENOUS
  Filled 2023-11-22: qty 2

## 2023-11-22 MED ORDER — ENOXAPARIN SODIUM 80 MG/0.8ML IJ SOSY: 70.0000 mg | PREFILLED_SYRINGE | INTRAMUSCULAR | Status: DC

## 2023-11-22 MED ORDER — ONDANSETRON HCL 4 MG/2ML IJ SOLN
INTRAMUSCULAR | Status: AC
Start: 2023-11-22 — End: 2023-11-22
  Filled 2023-11-22: qty 2

## 2023-11-22 MED ORDER — SODIUM CHLORIDE 0.9 % IV SOLN: INTRAVENOUS | Status: DC | PRN

## 2023-11-22 MED ORDER — POLYETHYLENE GLYCOL 3350 17 G PO PACK
17.0000 g | PACK | ORAL | Status: DC
  Administered 2023-11-25 – 2023-11-27 (×2): 17 g via ORAL
  Filled 2023-11-22 (×3): qty 1

## 2023-11-22 MED ORDER — SENNOSIDES-DOCUSATE SODIUM 8.6-50 MG PO TABS
2.0000 | ORAL_TABLET | Freq: Every evening | ORAL | Status: DC | PRN
  Administered 2023-11-25: 2 via ORAL
  Filled 2023-11-22: qty 2

## 2023-11-22 MED ORDER — LABETALOL HCL 5 MG/ML IV SOLN
20.0000 mg | INTRAVENOUS | Status: DC | PRN
  Administered 2023-11-22: 20 mg via INTRAVENOUS

## 2023-11-22 MED ORDER — OXYTOCIN-SODIUM CHLORIDE 30-0.9 UT/500ML-% IV SOLN
INTRAVENOUS | Status: DC | PRN
  Administered 2023-11-22: 300 mL via INTRAVENOUS
  Administered 2023-11-22: 41.7 mL/h via INTRAVENOUS

## 2023-11-22 MED ORDER — FENTANYL CITRATE (PF) 100 MCG/2ML IJ SOLN
INTRAMUSCULAR | Status: DC | PRN
  Administered 2023-11-22: 15 ug via INTRATHECAL

## 2023-11-22 MED ORDER — NIFEDIPINE ER OSMOTIC RELEASE 30 MG PO TB24
30.0000 mg | ORAL_TABLET | Freq: Every day | ORAL | Status: DC
  Administered 2023-11-22: 30 mg via ORAL
  Filled 2023-11-22: qty 1

## 2023-11-22 MED ORDER — POVIDONE-IODINE 10 % EX SWAB: 2.0000 | Freq: Once | CUTANEOUS | Status: DC

## 2023-11-22 MED ORDER — COCONUT OIL OIL: 1.0000 | TOPICAL_OIL | Status: DC | PRN

## 2023-11-22 MED ORDER — MAGNESIUM SULFATE BOLUS VIA INFUSION: 4.0000 g | Freq: Once | INTRAVENOUS | Status: AC

## 2023-11-22 MED ORDER — SOD CITRATE-CITRIC ACID 500-334 MG/5ML PO SOLN
ORAL | Status: AC
  Administered 2023-11-22: 30 mL via ORAL
  Filled 2023-11-22: qty 30

## 2023-11-22 MED ORDER — CEFAZOLIN SODIUM-DEXTROSE 3-4 GM/150ML-% IV SOLN
3.0000 g | INTRAVENOUS | Status: AC
  Administered 2023-11-22 (×2): 3 g via INTRAVENOUS

## 2023-11-22 MED ORDER — IBUPROFEN 600 MG PO TABS: 600.0000 mg | ORAL_TABLET | Freq: Four times a day (QID) | ORAL | Status: DC

## 2023-11-22 SURGICAL SUPPLY — 38 items
BENZOIN TINCTURE PRP APPL 2/3 (GAUZE/BANDAGES/DRESSINGS) ×1 IMPLANT
CANISTER SUCT 3000ML PPV (MISCELLANEOUS) ×1 IMPLANT
CANISTER WOUND CARE 500ML ATS (WOUND CARE) IMPLANT
CHLORAPREP W/TINT 26 (MISCELLANEOUS) ×2 IMPLANT
CLAMP UMBILICAL CORD (MISCELLANEOUS) ×1 IMPLANT
DRESSING PREVENA PLUS CUSTOM (GAUZE/BANDAGES/DRESSINGS) IMPLANT
DRSG OPSITE POSTOP 4X10 (GAUZE/BANDAGES/DRESSINGS) ×1 IMPLANT
ELECTRODE REM PT RTRN 9FT ADLT (ELECTROSURGICAL) ×1 IMPLANT
EXTRACTOR VACUUM KIWI (MISCELLANEOUS) ×1 IMPLANT
GAUZE PAD ABD 7.5X8 STRL (GAUZE/BANDAGES/DRESSINGS) IMPLANT
GAUZE SPONGE 4X4 12PLY STRL LF (GAUZE/BANDAGES/DRESSINGS) IMPLANT
GLOVE BIOGEL PI IND STRL 7.0 (GLOVE) ×2 IMPLANT
GLOVE BIOGEL PI IND STRL 7.5 (GLOVE) ×1 IMPLANT
GLOVE SURG SS PI 7.0 STRL IVOR (GLOVE) ×1 IMPLANT
GOWN STRL REUS W/ TWL LRG LVL3 (GOWN DISPOSABLE) ×2 IMPLANT
GOWN STRL REUS W/ TWL XL LVL3 (GOWN DISPOSABLE) ×1 IMPLANT
HEMOSTAT ARISTA ABSORB 3G PWDR (HEMOSTASIS) IMPLANT
MAT PREVALON FULL STRYKER (MISCELLANEOUS) IMPLANT
NS IRRIG 1000ML POUR BTL (IV SOLUTION) ×1 IMPLANT
PACK C SECTION WH (CUSTOM PROCEDURE TRAY) ×1 IMPLANT
PAD ABD 7.5X8 STRL (GAUZE/BANDAGES/DRESSINGS) ×1 IMPLANT
PAD OB MATERNITY 4.3X12.25 (PERSONAL CARE ITEMS) ×1 IMPLANT
PAD PREP 24X48 CUFFED NSTRL (MISCELLANEOUS) ×1 IMPLANT
RETRACTOR TRAXI PANNICULUS (MISCELLANEOUS) IMPLANT
RETRACTOR WND ALEXIS 25 LRG (MISCELLANEOUS) ×1 IMPLANT
SPONGE LAP 18X18 X RAY DECT (DISPOSABLE) IMPLANT
STAPLER INSORB 30 2030 C-SECTI (MISCELLANEOUS) IMPLANT
STRIP CLOSURE SKIN 1/2X4 (GAUZE/BANDAGES/DRESSINGS) ×1 IMPLANT
SUT CHROMIC 0 CT 1 (SUTURE) IMPLANT
SUT MNCRL 0 VIOLET CTX 36 (SUTURE) ×2 IMPLANT
SUT MON AB 4-0 PS1 27 (SUTURE) ×1 IMPLANT
SUT PDS AB 0 CTX 60 (SUTURE) IMPLANT
SUT VIC AB 0 CT1 36 (SUTURE) ×1 IMPLANT
SUT VICRYL+ 3-0 36IN CT-1 (SUTURE) ×1 IMPLANT
SUTURE PLAIN GUT 2.0 ETHICON (SUTURE) ×1 IMPLANT
TAPE CLOTH SURG 4X10 WHT LF (GAUZE/BANDAGES/DRESSINGS) IMPLANT
TOWEL OR 17X24 6PK STRL BLUE (TOWEL DISPOSABLE) ×2 IMPLANT
WATER STERILE IRR 1000ML POUR (IV SOLUTION) ×1 IMPLANT

## 2023-11-22 NOTE — Brief Op Note (Addendum)
 11/22/2023  2:15 PM  PATIENT:  Glenda Romero  38 y.o. female  PRE-OPERATIVE DIAGNOSIS:  Fetal bradycardia in the 60s-70s. Abdominal pain with concern for abruption. 26wks by difficult bedside ultrasound. Twin pregnancy. No prenatal care. BMI 50s. History of c-section x 4. Anemia.   POST-OPERATIVE DIAGNOSIS:  Same. Abruption. Delivered  PROCEDURE:  Stat repeat c-section via vertical midline incision with prevena placement  SURGEON:  Surgeons and Role:    * Izell Harari, MD - Primary  ASSISTANTS:    DEWAINE Lola Donnice CHRISTELLA, MD - Assisting   ANESTHESIA:   CSE  EBL:  with abruption accounting for approximately 1L   BLOOD ADMINISTERED: 2U PRBC  IVF: 2500mL crystalloid  DRAINS: indwelling foley, clear with UOP   LOCAL MEDICATIONS USED:  NONE  SPECIMEN:  placentas to path. AV cord gases x 2  DISPOSITION OF SPECIMEN:  pathology and lab  COUNTS:  YES  TOURNIQUET:  * No tourniquets in log *  DICTATION: .Note written in EPIC  PLAN OF CARE: Admit to inpatient   PATIENT DISPOSITION:  PACU - hemodynamically stable.   Delay start of Pharmacological VTE agent (>24hrs) due to surgical blood loss or risk of bleeding: yes  Harari Izell Raddle MD Attending [facility] for [employer] (Faculty Practice) 11/22/2023 TIME: [phone]

## 2023-11-22 NOTE — MAU Provider Note (Signed)
 History     CSN: [id number]  Arrival date and time: 11/22/23 0848   Event Date/Time   First Provider Initiated Contact with Patient 11/22/23 0902      Chief Complaint  Patient presents with   Contractions   Hypertension   HPI [name] is a 38 y.o. year old [id number] female at Unknown weeks gestation who presents to MAU reporting contractions since 0500 this morning and elevated blood pressure. She reports she recently moved to the area from [place], [place] ~ 3 weeks ago. She reports getting PNC at [facility] in [place], [place]; last visit was ~10/28/2023 per patient. Her pregnancy is complicated by: cHTN (taking Lisinopril 20 mg -- last dose this morning at 0700), morbid obesity, h/o multiple gestation, h/o C/S x 3, and poor dentition. Her partner, Glenda Romero, is present and contributing to the history taking.   OB History     Gravida  5   Para  4   Term  3   Preterm  1   AB  0   Living  5      SAB  0   IAB  0   Ectopic  0   Multiple  1   Live Births  5           Past Medical History:  Diagnosis Date   Gestational hypertension    Obesity     Past Surgical History:  Procedure Laterality Date   TONSILLECTOMY      No family history on file.  Social History   Tobacco Use   Smoking status: Never  Substance Use Topics   Alcohol use: No   Drug use: No    Allergies: No Known Allergies  Medications Prior to Admission  Medication Sig Dispense Refill Last Dose/Taking   azithromycin  (ZITHROMAX ) 250 MG tablet Take 1 tablet (250 mg total) by mouth daily. Take 1 every day until finished beginning tomorrow. You received your first dose in the emergency department 4 tablet 0    benzonatate  (TESSALON ) 100 MG capsule Take 1 capsule (100 mg total) by mouth once. 20 capsule 0    ibuprofen  (ADVIL ,MOTRIN ) 600 MG tablet Take 1 tablet (600 mg total) by mouth once. 30 tablet 0    ondansetron  (ZOFRAN -ODT) 8 MG disintegrating tablet Take 1 tablet (8 mg total) by  mouth once. 20 tablet 0    pseudoephedrine  (SUDAFED) 120 MG 12 hr tablet Take 1 tablet (120 mg total) by mouth once. 14 tablet 0     Review of Systems  Constitutional: Negative.   HENT: Negative.    Eyes: Negative.   Respiratory: Negative.    Cardiovascular: Negative.   Gastrointestinal: Negative.   Endocrine: Negative.   Genitourinary:  Positive for pelvic pain (frequent contractions). Negative for vaginal bleeding.  Musculoskeletal: Negative.   Skin: Negative.   Allergic/Immunologic: Negative.   Neurological: Negative.   Hematological: Negative.   Psychiatric/Behavioral: Negative.     Physical Exam  Patient Vitals for the past 24 hrs:  BP Pulse  11/22/23 0947 (!) 170/97 (!) 102  11/22/23 0911 (!) 150/95 87    Physical Exam Vitals and nursing note reviewed. Exam conducted with a chaperone present.  Constitutional:      General: She is in acute distress.     Appearance: Normal appearance. She is obese.  Pulmonary:     Effort: Tachypnea present.  Genitourinary:    Comments: Deferred d/t to no knowledge of placental location Musculoskeletal:  General: Normal range of motion.  Skin:    General: Skin is warm and dry.  Neurological:     Mental Status: She is alert and oriented to person, place, and time.  Psychiatric:        Mood and Affect: Mood normal.        Behavior: Behavior normal.        Thought Content: Thought content normal.        Judgment: Judgment normal.    FHTs by U/S: 115 bpm & 120 bpm (unable to obtain [facility] by EFM due to maternal habitus, fetal locations and maternal pain level.  Reassessment @ [phone]: Requesting for RN to get baby on EFM or doppler FHR. RN reports that U/S is at the bedside. Reassessment @ [phone]: Introduced patient and support person, Glenda Romero, to Dr. Jomarie. Discussed reason fro Dr. Jomarie coming to talk to her is to prepare her for a RCS. Patient states, No, no. I am not ready for this. Y'all don't know I've had 3 bad experiences  with c/s. I am not ready for this right now. Dr. Jomarie assumes care of the patient at this time.  MAU Course  Procedures  MDM Start IV LR infusion @ 125 mL/hr Prenatal Panel CCUA CBC CMP P/C Ratio Serial BP's  OB MFM Limited U/S at bedside  Results for orders placed or performed during the hospital encounter of 11/22/23 (from the past 24 hours)  Type and screen [facility]     Status: None (Preliminary result)   Collection Time: 11/22/23  9:08 AM  Result Value Ref Range   ABO/RH(D) PENDING    Antibody Screen PENDING    Sample Expiration      11/25/2023,2359 Performed at [facility], [address] [place], [place] [address]      No results found.      *Consult with Dr. Izell @ [phone] - notified of patient's complaints, assessments, lab & U/S results  TC to Dr. Lola @ [phone] - notified of patient's complaints, assessments, lab & U/S results, tx plan patient needs RCS d/t her h/o CS, twin gestation and placental abruption. Orders given to call Dr. Jomarie to come get patient prepped for RCS  TC to Dr. Jomarie @ [phone] - notified of patient's complaints, assessments, lab & U/S results, tx plan patient needs RCS d/t her h/o CS, twin gestation and placental abruption.  Dr. Jomarie at bedside at [phone].  [phone]: Dr. Lola at bedside.  1000: Dr. Izell at bedside.  Assessment and Plan  1. Placental abruption in third trimester (Primary)  2. Twin gestation in third trimester, unspecified multiple gestation type  3. History of cesarean delivery Comments: x 3  4. Limited prenatal care in third trimester  5. [redacted] weeks gestation of pregnancy - Admit to OR - Prep for OR  - Dr. Clent in charge of patient care -- see H&P documentation by one of them  Ala Cart, CNM 11/22/2023, 9:03 AM

## 2023-11-22 NOTE — Progress Notes (Signed)
   11/22/23 1600  Spiritual Encounters  Type of Visit Initial  Care provided to: Pt and family  Referral source Nurse (RN/NT/LPN)  Reason for visit Grief/loss  OnCall Visit Yes  Spiritual Framework  Presenting Themes Significant life change  Community/Connection Family  Patient Stress Factors Loss;Major life changes  Family Stress Factors Loss;Major life changes  Interventions  Spiritual Care Interventions Made Established relationship of care and support;Compassionate presence;Reflective listening;Normalization of emotions;Prayer  Intervention Outcomes  Outcomes Connection to spiritual care;Awareness around self/spiritual resourses;Awareness of support    Chaplain was paged to provide support for the family. The patient shared her feelings and emotions.Chaplain acknowledged the pain, provided support and offered a prayer.    M.Kubra Susanna Kerry Resident [phone]

## 2023-11-22 NOTE — Anesthesia Preprocedure Evaluation (Signed)
 Anesthesia Evaluation  Patient identified by MRN, date of birth, ID band Patient awake    Reviewed: Allergy & Precautions, NPO status , Patient's Chart, lab work & pertinent test results  History of Anesthesia Complications Negative for: history of anesthetic complications  Airway Mallampati: III  TM Distance: >3 FB Neck ROM: Full    Dental no notable dental hx. (+) Teeth Intact   Pulmonary neg pulmonary ROS, neg sleep apnea, neg COPD, Patient abstained from smoking.Not current smoker   Pulmonary exam normal breath sounds clear to auscultation       Cardiovascular Exercise Tolerance: Good METShypertension, Pt. on medications (-) CAD and (-) Past MI (-) dysrhythmias  Rhythm:Regular Rate:Normal - Systolic murmurs    Neuro/Psych negative neurological ROS  negative psych ROS   GI/Hepatic ,neg GERD  ,,(+)     (-) substance abuse    Endo/Other  neg diabetes  Class 4 obesity  Renal/GU Renal disease     Musculoskeletal   Abdominal  (+) + obese  Peds  Hematology Denies blood thinner use or bleeding disorders. Concern for abruption currently, hgb = 8.1. Hemodynamically stable. Platelet count normal.    Anesthesia Other Findings Denies blood thinner use or bleeding diatheses. Recent labs reviewed. Past Medical History: No date: Gestational hypertension No date: Obesity   Reproductive/Obstetrics (+) Pregnancy                              Anesthesia Physical Anesthesia Plan  ASA: 3  Anesthesia Plan: Combined Spinal and Epidural   Post-op Pain Management: Ofirmev  IV (intra-op)*   Induction:   PONV Risk Score and Plan: 4 or greater and Ondansetron  and Dexamethasone  Airway Management Planned: Natural Airway  Additional Equipment:   Intra-op Plan:   Post-operative Plan:   Informed Consent: I have reviewed the patients History and Physical, chart, labs and discussed the procedure  including the risks, benefits and alternatives for the proposed anesthesia with the patient or authorized representative who has indicated his/her understanding and acceptance.       Plan Discussed with: CRNA and Surgeon  Anesthesia Plan Comments: (Discussed R/B/A of neuraxial anesthesia technique with patient: - rare risks of spinal/epidural hematoma, nerve damage, infection - Risk of PDPH - Risk of itching - Risk of nausea and vomiting - Risk of conversion to general anesthesia and its associated risks, including sore throat, damage to lips/teeth/oropharynx, and rare risks such as cardiac and respiratory events. - Risk of surgical bleeding requiring blood products - Risk of allergic reactions Discussed the role of CRNA in patient's perioperative care.  Discussed higher risk of difficult spinal given high BMI, and risks associated with GETA.  Patient voiced understanding.)         Anesthesia Quick Evaluation

## 2023-11-22 NOTE — Anesthesia Postprocedure Evaluation (Signed)
 Anesthesia Post Note  Patient: Glenda Romero  Procedure(s) Performed: CESAREAN SECTION, MULTIPLE FETUSES (Abdomen)     Patient location during evaluation: L&D Anesthesia Type: Combined Spinal/Epidural Level of consciousness: oriented and awake and alert Pain management: pain level controlled Vital Signs Assessment: post-procedure vital signs reviewed and stable Respiratory status: spontaneous breathing, respiratory function stable and patient connected to nasal cannula oxygen Cardiovascular status: blood pressure returned to baseline and stable Postop Assessment: no headache, no backache, no apparent nausea or vomiting and spinal receding Anesthetic complications: no   No notable events documented.  Last Vitals:  Vitals:   11/22/23 1559 11/22/23 1600  BP:  (!) 148/99  Pulse: 67 68  Resp: 16 17  Temp:    SpO2: 99% 97%    Last Pain:  Vitals:   11/22/23 1556  TempSrc:   PainSc: 0-No pain   Pain Goal:    LLE Motor Response: Purposeful movement (11/22/23 1556) LLE Sensation: Tingling (11/22/23 1556) RLE Motor Response: Purposeful movement (11/22/23 1556) RLE Sensation: Tingling (11/22/23 1556)     Epidural/Spinal Function Cutaneous sensation: Tingles (11/22/23 1556), Patient able to flex knees:  (yes, slightly) (11/22/23 1556), Patient able to lift hips off bed: No (11/22/23 1556), Back pain beyond tenderness at insertion site: No (11/22/23 1556), Progressively worsening motor and/or sensory loss: No (11/22/23 1556), Bowel and/or bladder incontinence post epidural: No (11/22/23 1556)  Rome Ade

## 2023-11-22 NOTE — Plan of Care (Signed)
  Problem: Education: Goal: Knowledge of disease or condition will improve Outcome: Progressing Goal: Knowledge of the prescribed therapeutic regimen will improve Outcome: Progressing   Problem: Fluid Volume: Goal: Peripheral tissue perfusion will improve Outcome: Progressing   Problem: Clinical Measurements: Goal: Complications related to disease process, condition or treatment will be avoided or minimized Outcome: Progressing   Problem: Education: Goal: Knowledge of General Education information will improve Description: Including pain rating scale, medication(s)/side effects and non-pharmacologic comfort measures Outcome: Progressing   Problem: Health Behavior/Discharge Planning: Goal: Ability to manage health-related needs will improve Outcome: Progressing   Problem: Clinical Measurements: Goal: Ability to maintain clinical measurements within normal limits will improve Outcome: Progressing Goal: Will remain free from infection Outcome: Progressing Goal: Diagnostic test results will improve Outcome: Progressing Goal: Respiratory complications will improve Outcome: Progressing Goal: Cardiovascular complication will be avoided Outcome: Progressing   Problem: Activity: Goal: Risk for activity intolerance will decrease Outcome: Progressing   Problem: Nutrition: Goal: Adequate nutrition will be maintained Outcome: Progressing   Problem: Coping: Goal: Level of anxiety will decrease Outcome: Progressing   Problem: Elimination: Goal: Will not experience complications related to bowel motility Outcome: Progressing Goal: Will not experience complications related to urinary retention Outcome: Progressing   Problem: Pain Managment: Goal: General experience of comfort will improve and/or be controlled Outcome: Progressing   Problem: Safety: Goal: Ability to remain free from injury will improve Outcome: Progressing   Problem: Skin Integrity: Goal: Risk for impaired  skin integrity will decrease Outcome: Progressing   Problem: Education: Goal: Knowledge of the prescribed therapeutic regimen will improve Outcome: Progressing Goal: Understanding of sexual limitations or changes related to disease process or condition will improve Outcome: Progressing Goal: Individualized Educational Video(s) Outcome: Progressing   Problem: Self-Concept: Goal: Communication of feelings regarding changes in body function or appearance will improve Outcome: Progressing   Problem: Skin Integrity: Goal: Demonstration of wound healing without infection will improve Outcome: Progressing   Problem: Education: Goal: Knowledge of condition will improve Outcome: Progressing Goal: Individualized Educational Video(s) Outcome: Progressing Goal: Individualized Newborn Educational Video(s) Outcome: Progressing   Problem: Activity: Goal: Will verbalize the importance of balancing activity with adequate rest periods Outcome: Progressing Goal: Ability to tolerate increased activity will improve Outcome: Progressing   Problem: Coping: Goal: Ability to identify and utilize available resources and services will improve Outcome: Progressing   Problem: Life Cycle: Goal: Chance of risk for complications during the postpartum period will decrease Outcome: Progressing   Problem: Role Relationship: Goal: Ability to demonstrate positive interaction with newborn will improve Outcome: Progressing   Problem: Skin Integrity: Goal: Demonstration of wound healing without infection will improve Outcome: Progressing

## 2023-11-22 NOTE — H&P (Signed)
 LABOR AND DELIVERY ADMISSION HISTORY AND PHYSICAL NOTE  Tehila Sokolow is a 38 y.o. female [phone] with IUP at [redacted]w[redacted]d presenting for abdominal pain.   Patient received prenatal care in [place] , does not have records immediately available. Reports she has had one vaginal delivery followed by three cesareans. She reports bowel injury with her last cesarean requiring general surgery assistance to come repair, as well as being told that she had significant amounts of scar tissue.   She was told early on that she had twins but lost one. She reports a history of high blood pressure and has been on lisinopril for her whole pregnancy.  Currently endorses having dizziness.   On bedside US  patient is found to have twin gestation, vertex/breech, measuring approximately 28 weeks, both with normal FHR. Anterior placenta with abruption. Unclear if previa is present.   Prenatal History/Complications: Unknown besides cesarean x3 with extensive adhesions/complications and cHTN on lisinopril  Pregnancy complications:  Patient Active Problem List   Diagnosis Date Noted   Chronic hypertension affecting pregnancy 11/22/2023   BMI 50.0-59.9, adult (HCC) 11/22/2023   Twin gestation in third trimester 11/22/2023   No prenatal care in current pregnancy in second trimester 11/22/2023   AKI (acute kidney injury) 11/22/2023   History of cesarean delivery affecting pregnancy 11/22/2023   Placenta abruptio, antepartum, third trimester 11/22/2023    Past Medical History: Past Medical History:  Diagnosis Date   Gestational hypertension    Obesity     Past Surgical History: Past Surgical History:  Procedure Laterality Date   TONSILLECTOMY      Obstetrical History: OB History     Gravida  5   Para  4   Term  3   Preterm  1   AB  0   Living  5      SAB  0   IAB  0   Ectopic  0   Multiple  1   Live Births  5           Social History: Social History   Socioeconomic History   Marital  status: Married    Spouse name: Not on file   Number of children: Not on file   Years of education: Not on file   Highest education level: Not on file  Occupational History   Not on file  Tobacco Use   Smoking status: Never   Smokeless tobacco: Not on file  Substance and Sexual Activity   Alcohol use: No   Drug use: No   Sexual activity: Not on file  Other Topics Concern   Not on file  Social History Narrative   Not on file   Social Drivers of Health   Financial Resource Strain: Not on file  Food Insecurity: Not on file  Transportation Needs: Not on file  Physical Activity: Not on file  Stress: Not on file  Social Connections: Not on file    Family History: No family history on file.  Allergies: No Known Allergies  Medications Prior to Admission  Medication Sig Dispense Refill Last Dose/Taking   azithromycin  (ZITHROMAX ) 250 MG tablet Take 1 tablet (250 mg total) by mouth daily. Take 1 every day until finished beginning tomorrow. You received your first dose in the emergency department 4 tablet 0    benzonatate  (TESSALON ) 100 MG capsule Take 1 capsule (100 mg total) by mouth once. 20 capsule 0    ibuprofen  (ADVIL ,MOTRIN ) 600 MG tablet Take 1 tablet (600 mg total) by mouth once. 30  tablet 0    ondansetron  (ZOFRAN -ODT) 8 MG disintegrating tablet Take 1 tablet (8 mg total) by mouth once. 20 tablet 0    pseudoephedrine  (SUDAFED) 120 MG 12 hr tablet Take 1 tablet (120 mg total) by mouth once. 14 tablet 0      Review of Systems  All systems reviewed and negative except as stated in HPI  Physical Exam BP (!) 132/94   Pulse 79   Temp 97.9 F (36.6 C) (Oral)   Resp 16   Ht 5' 6 (1.676 m)   Wt (!) 143.4 kg   BMI 51.04 kg/m   Physical Exam Constitutional:      Appearance: She is obese. She is ill-appearing.     Comments: Appears uncomfortable  HENT:     Mouth/Throat:     Mouth: Mucous membranes are moist.  Eyes:     General: No scleral icterus. Pulmonary:      Effort: Pulmonary effort is normal. No respiratory distress.  Abdominal:     Comments: Large pannus. Diffusely tender to palpation  Skin:    General: Skin is warm and dry.  Psychiatric:     Comments: Anxious affect     FHR: 118 A/ 115 B  Prenatal labs: ABO, Rh: --/--/B POS (10/15 0935) Antibody: NEG (10/15 0935) Rubella:   RPR:    HBsAg:    HIV:    GC/Chlamydia:  Neisseria Gonorrhea  Date Value Ref Range Status  07/18/2014 Negative  Final    Comment:    Normal Reference Range - Negative   Chlamydia  Date Value Ref Range Status  07/18/2014 **POSITIVE** (A)  Final    Comment:    Normal Reference Range - Negative   GBS:    Prenatal Transfer Tool  Maternal Diabetes: patient denies, unknown Genetic Screening: unknown Maternal Ultrasounds/Referrals: none Fetal Ultrasounds or other Referrals:   Maternal Substance Abuse:  unknown Significant Maternal Medications:  Meds include: Other: lisinopril Significant Maternal Lab Results:   Results for orders placed or performed during the hospital encounter of 11/22/23 (from the past 24 hours)  CBC   Collection Time: 11/22/23  9:35 AM  Result Value Ref Range   WBC 18.9 (H) 4.0 - 10.5 K/uL   RBC 3.52 (L) 3.87 - 5.11 MIL/uL   Hemoglobin 8.1 (L) 12.0 - 15.0 g/dL   HCT 72.4 (L) 63.9 - 53.9 %   MCV 78.1 (L) 80.0 - 100.0 fL   MCH 23.0 (L) 26.0 - 34.0 pg   MCHC 29.5 (L) 30.0 - 36.0 g/dL   RDW 81.0 (H) 88.4 - 84.4 %   Platelets PENDING 150 - 400 K/uL   nRBC 0.0 0.0 - 0.2 %  Comprehensive metabolic panel with GFR   Collection Time: 11/22/23  9:35 AM  Result Value Ref Range   Sodium 136 135 - 145 mmol/L   Potassium 4.1 3.5 - 5.1 mmol/L   Chloride 106 98 - 111 mmol/L   CO2 16 (L) 22 - 32 mmol/L   Glucose, Bld 109 (H) 70 - 99 mg/dL   BUN 17 6 - 20 mg/dL   Creatinine, Ser 8.10 (H) 0.44 - 1.00 mg/dL   Calcium 8.1 (L) 8.9 - 10.3 mg/dL   Total Protein 5.8 (L) 6.5 - 8.1 g/dL   Albumin 1.9 (L) 3.5 - 5.0 g/dL   AST 24 15 - 41 U/L    ALT 10 0 - 44 U/L   Alkaline Phosphatase 113 38 - 126 U/L   Total Bilirubin 1.0 0.0 -  1.2 mg/dL   GFR, Estimated 34 (L) >60 mL/min   Anion gap 14 5 - 15  Type and screen [facility]   Collection Time: 11/22/23  9:35 AM  Result Value Ref Range   ABO/RH(D) B POS    Antibody Screen NEG    Sample Expiration      11/25/2023,2359 Performed at [facility], [address], [place], [place] [address]     Assessment: Porschea Borys is a 38 y.o. [id number] at [redacted]w[redacted]d here for unscheduled, urgent cesarean section.  #Repeat Low Transverse Cesarean  #Placental abruption #Suspected preterm labor The risks of cesarean section discussed with the patient included but were not limited to: bleeding which may require transfusion or reoperation; infection which may require antibiotics; injury to bowel, bladder, ureters or other surrounding organs; injury to the fetus; need for additional procedures including hysterectomy in the event of a life-threatening hemorrhage; placental abnormalities with subsequent pregnancies, incisional problems, thromboembolic phenomenon and other postoperative/anesthesia complications. The patient concurred with the proposed plan, giving informed written consent for the procedure. Patient NPO status waived given urgency of case. Anesthesia and OR aware. Preoperative prophylactic antibiotics and SCDs ordered on call to the OR.   Patient advised by both myself and Dr. Izell at bedside that we should proceed with cesarean delivery due to suspected abruption seen on US  and significant patient pain suspicious for abruption and preterm labor, as well as severe pre-eclampsia. Patient took a significant amount of time to reach the decision and both myself, Dr. Izell, and Dr. Jomarie returned on multiple occasions to counsel and discuss. During this time we also performed several spot checks of FHR which were normal.   We also recommended proceeding with vertical  incision, patient again counseled on multiple occasions but is still undecided, we will address again in the OR.   Also asked regarding contraception and specifically bilateral salpingectomy if technically feasible, she ultimately declined.  2u pRBC T&C ordered, low threshold to transfuse and will also give TXA.   #Anesthesia: spinal #FWB: FHR 118/115 #GBS/ID: Unknown #MOF: TBD #MOC: undecided #Circ: TBD  #Severe pre-eclampsia: severe range BP's since arrival, will send labs and start Mg gtt as well as antiHTN meds per protocol  #Prenatal care: will send ROI once feasible  Donnice CHRISTELLA Carolus, MD/MPH Attending Family Medicine Physician, [facility], [facility]  11/22/2023, 10:54 AM

## 2023-11-22 NOTE — OR Nursing (Signed)
 R inner thigh small abrasion about 2-2.5in in length noted after surgery. Per Dr Izell applied gauze dressing with petroleum jelly and tegaderm.

## 2023-11-22 NOTE — MAU Note (Addendum)
 38yoF with unknown Gs and Ps presents at 32-34 weeks with contractions starting around 0500 this morning. States I have high blood pressure and I can tell it's high right now. Roomed patient and called Letha CNM immediately to bedside to assess as patient is preterm and appears in active labor. Patient states moved here from [place] several weeks ago, has not established care with any office here yet. Takes lisinopril for blood pressure, last took at 0700 this AM. Endorses +FM, unable to doppler d/t pt habitus. Denies leaking of fluid and bleeding. Breathing through contractions.

## 2023-11-22 NOTE — Progress Notes (Addendum)
 Late Entry  Called to see patient. No PNC (?maybe one u/s in early pregnancy at [facility] in [place] per patient but nothing in care everywhere). Patient presents for severe abdominal and back pain. Patient immediately checked into triage room and u/s obtained and appears to be twins at 26wks with twins but u/s very limited due to body habitus and patient moving around due to pain. One placenta appears (unsure of chorionicity) anterior but no overt e/o accreta but ?previa. FHR on u/s wnl for both babies.   Patient also with severe range BPs (SBPs 170s). She states she has CHTN and has been on lisinopril  Cx exam, closed/long and no blood on glove. Belly, obese, diffusely ttp with peau d'orange dimpling, well healed low transverse skin incisions  Given the amount of pain she is in, we can not monitor the babies and concern is for abruption. I told her that given this, I recommend a c-section. She thinks she might be 32wks but I told her that regardless of whether she is 32wks and small, measuring 26wks, or is truly 26wks, that given how much abdominal pain I recommend proceeding with a c-section. I told her that it is highly risky; she states she had a bowel injury with her last c-section a year ago and we have an op note in care everywhere from a few years ago that states a large amount of scar tissue. I told her very high risk chance of injury to things nearby, need for hyst, transfusion but c/s is the only way for delivery. I also told her I recommend likely doing a vertical midline which may decrease the risk of the surgery  She is not wanting a vertical midline and she is not wanting a c-section because of concern for prematurity. I told her that is a real concern but the primary reason is due to risk of getting sicker for both mom and baby and ultimately, IUFD for mom.  Patient still undecided. Mg started and labs drawn.   I did another u/s and very difficult due to body habitus and patient pain but I  got one FHR that was wnl and unable to see the other one clearly  Additional labs getting drawn and pt weight and heigh and anesthesia to talk to her and then I told her I'll come back and talk to her more  I talked to anesthesia and it's a difficult choice but if I feel waiting for labs and potentially doing regional would be best vs doing general for her.   Bebe Izell Raddle MD Attending Center for [employer] (Faculty Practice) 11/22/2023 Time: 1035am

## 2023-11-22 NOTE — Lactation Note (Signed)
 This note was copied from a Glenda's chart. Lactation Consultation Note  Patient Name: Glenda Romero Today's Date: 11/22/2023 Age:39 hours   Spoke to [facility] RN Aleck and she voiced Glenda Romero will be coming to the 1st floor eventually, still in PACU. Feeding choice an admission unknown, Glenda Girl A passed away and only surviving twin is Glenda Romero. RN advised to wait for a lactation consult until tomorrow; MOM is on Mag and very sleepy. LC services to F/U for initial assessment tomorrow if Ms. Kiang decides to provide breastmilk for Glenda Romero.  Glenda Romero 11/22/2023, 5:48 PM

## 2023-11-22 NOTE — Progress Notes (Signed)
 In to see patient due to lab abnormalities and low urine output.  Patient feels well. Reports pain from surgery but denies chest pain/shortness of breath or otherwise feeling unwell.  Vitals BP 136/80   Pulse 68   Temp 98.1 F (36.7 C) (Oral)   Resp 20   Ht 5' 6 (1.676 m)   Wt (!) 143.4 kg   SpO2 95%   Breastfeeding Unknown   BMI 51.04 kg/m   Gen: appears well, alert/oriented CV: RRR Lungs: CTAB Abd: soft, appropriately tender, obese, prevena in place Extremities: 2+ pitting edema in bilateral lower extremities  Lab Results  Component Value Date   WBC 25.6 (H) 11/22/2023   HGB 8.6 (L) 11/22/2023   HCT 27.4 (L) 11/22/2023   MCV 80.1 11/22/2023   PLT 136 (L) 11/22/2023   Lab Results  Component Value Date   NA 133 (L) 11/22/2023   K 5.4 (H) 11/22/2023   CO2 19 (L) 11/22/2023   GLUCOSE 124 (H) 11/22/2023   BUN 20 11/22/2023   CREATININE 2.43 (H) 11/22/2023   CALCIUM 7.7 (L) 11/22/2023   GFRNONAA 25 (L) 11/22/2023   A/P: Acute kidney injury with oliguria Hyperkalemia  Etiology likely 2/2 preeclampsia. Repeat labs this evening demonstrate an elevated potassium 5.4, creatinine is continuing to increase to 2.43, calcium is low at 7.7, mag level is 4.6. I have ordered a stat EKG. I have discontinued her IV labetalol which can worsen hyperkalemia. I have ordered a 500 ml bolus to see if that can help. Will proceed cautiously with fluid resuscitation to avoid volume overload. I have consulted the hospitalists for assistance in management of her electrolyte abnormalities and oligouria/anuria. They have recommended that I call nephrology. I spoke with Dr. Macel from nephrology. He recommends Lokelma 10 g x 1 and reassess labs in morning, continue supportive care and give it time. They will formally consult in morning.  Rollo ONEIDA Bring, MD, FACOG Obstetrician & Gynecologist, [facility] for [facility], [facility]

## 2023-11-22 NOTE — Progress Notes (Signed)
 OB Note Bedside u/s and normal FHR in the 120s-140s x 2. D/w patient and Hgb is low (plts pending) and I told her that this could also be another sign of abruption. she is amenable with proceeding. Will order 2u PRBCs  Bebe Izell Raddle MD Attending Center for [employer] (Faculty Practice) 11/22/2023 Time: [phone]

## 2023-11-22 NOTE — Op Note (Signed)
 Operative Note   SURGERY DATE: 11/22/2023  PRE-OP DIAGNOSIS:  *Pregnancy at 26-27wks by difficult bedside ultrasound *Fetal bradycardia *Abdominal pain, concerning for abruption *BMI 50s *History of cesarean section x 4 *Severe pre-eclampsia by BPs and AKI superimposed on chronic hypertension  POST-OP DIAGNOSIS: Same. Delivered. Abruption. Likely sleep apnea based on snoring in the OR  PROCEDURE: Stat repeat mid transverse cesarean section via vertical skin incision with double layer uterine closure and Prevena placement  SURGEON: Surgeons and Role:    * Izell Harari, MD - Primary  ASSISTANT:    DEWAINE Lola Donnice CHRISTELLA, MD - Assisting  An experienced assistant was required given the standard of surgical care given the complexity of the case.  This assistant was needed for exposure, dissection, suctioning, retraction, instrument exchange, assisting with delivery with administration of fundal pressure, and for overall help during the procedure.  ANESTHESIA: CSE, which worked great during the entire case  ESTIMATED BLOOD LOSS: ; for the case with of clot in the uterus from the abruption  DRAINS: clear UOP via indwelling foley  TOTAL IV FLUIDS: 2500mLmL crystalloid, 2U PRBC  VTE PROPHYLAXIS: SCDs to bilateral lower extremities  ANTIBIOTICS: Three grams of ancef and azithromycin  500mg  IV given intra-operatively and re-dose in the OR. Plan is to continue both of these for 24h post  SPECIMENS: placenta to pathology  COMPLICATIONS: body habitus, prior surgical history  FINDINGS: Very deep and edematous subcutaneous tissue. Dense adhesions at the lower uterine segment. No adnexal abnormalities via palpation and normal appearing fallopian tube isthmus and ampulla bilaterally. Dark blood colored amniotic fluid x 2, cephalic x 2,  female infants. A: 981gm, APGARs 1/0/0 B: 1100gm, APGARs 2/7  Intact placentas, appeared dichorionic-diamniotic  PROCEDURE IN  DETAIL: The patient was taken to the operating room where anesthesia was administered, and as soon as the patient was laid back I did a bedside ultrasound and saw a fetal heart rate in the 60s-70s. At this time, I called for a stat c-section.  A Traxi retractor was placed, and she was then splash prepped, foley inserted, and draped in the normal fashion in the dorsal supine position with a leftward tilt, with patient vomiting large amounts during this time but with head tilted to the side.  A vertical midline skin incision was made with the scalpel and carried through to the underlying layer of fascia. The fascia was then incised at the midline and this incision was extended inferiorly and superiorly with the scalpel. The rectus muscles were then separated in the midline and the peritoneum was entered bluntly and an Alexis retractor placed; dense adhesions noted in the lower uterine. Due to the patient's body habitus and scar tissue, the time was 10-12 minutes from skin incision to delivery of the first baby.   A mid transverse hysterotomy was made with the scalpel until the endometrial cavity was breached and the amniotic sac ruptured with the Allis clamp. This incision was extended bluntly and the infant's head, shoulders and body were delivered atraumatically.The cord was clamped x 2 and cut, and the infant was immediately handed to the awaiting pediatricians.  The next sac was then ruptured and the baby delivered atraumatically, cord was then clamped and cut and the baby immediately handed to the peds team.The placenta was then gradually expressed from the uterus and appeared intact; I was unable to exteriorize the uterus. A large uterine curette was used to ensure there was a gritty texture to the endometrial cavity diffusely including  at the lower uterine segment. The hysterotomy was repaired with a running suture of 1-0 monocryl. A second imbricating layer of 1-0 monocryl suture was then placed. Several  figure-of-eight sutures of 1-0 monocryl and 2-0 chromic were added to achieve excellent hemostasis, along with Arista  While in OB triage, the patient's husband mentioned to the patient doing a BTL but the patient stated she did not want one. Based on the anatomy, a Pomeroy or a Filshie clips and maybe a [place] would've been viable options but not salpingectomies due to unable to visualize the meso-salpinges.   The fascia was closed with #1 looped PDS. The subcutaneous layer was then  irrigated and reapproximated with several layers of 2-0 plain gut and the skin closed with staples. A Prevena wound device was then placed  The patient  tolerated the procedure well. Sponge, lap, needle, and instrument counts were correct x 2. The patient was transferred to the recovery room awake, alert and breathing independently in stable condition.  INDICATIONS: Patient presented to [facility] triage with severe abdominal pain. Patient was very uncomfortable and with difficult body habitus and unable to get FHR on the monitor. Ultrasound with sonographer ordered by triage CNM and this was very difficult for the sonographer. I arrived to [facility] triage for the last five minutes of the ultrasound, with the Dr. Lola already present at the ultrasound. The ultrasound showed a twin pregnancy. The patient with no prenatal care and she told me that  she had an early ultrasound somewhere but thought she had lost one of the twins. Measurements consistent with 26-27wks and FHR in the 110s-120s for both and concern for potential previa with anterior placenta seen.  Patient impossible to monitor with EFM due to how babies were positioned, patient's body habitus and patient constantly moving around due to pain and pain with pressing on her belly. I told her that her signs and symptoms are very concerning for a placental abruption and I recommended delivery via c-section due to risk to both her and the babies. The patient declined multiple times  because she felt the babies were too premature. I told her that their sizes indicates viability and they are preterm and/or potentially FGR but given her presentation delivery is recommended. Labs were being drawn and were in process with the OR aware and getting the room prepared in case patient was amenable to proceeding. I felt that given the intermittent ultrasounds showing normal FHRs that waiting for the platelets to come back and doing regional would be best given her likely having a difficult airway and to avoid general anesthesia, if at all possible.   Bebe Izell Overcast MD Attending Center for [facility])

## 2023-11-22 NOTE — Anesthesia Procedure Notes (Addendum)
 Combined Spinal Epidural  Start time: 11/22/2023 11:20 AM End time: 11/22/2023 11:30 AM  Staffing Anesthesiologist: Boone Fess, MD Performed: anesthesiologist   Preanesthetic Checklist Completed: patient identified, IV checked, risks and benefits discussed, surgical consent, monitors and equipment checked, pre-op evaluation and timeout performed  Epidural Patient position: sitting Prep: ChloraPrep Patient monitoring: heart rate, cardiac monitor, continuous pulse ox and blood pressure Approach: midline Location: L3-L4 Injection technique: LOR saline  Needle:  Needle type: Whitacre  Needle gauge: 25 G Needle length: 10 cm Needle insertion depth: 9 cm Needle type: Tuohy  Needle gauge: 17 G Needle length: 9 cm Needle insertion depth: 9 cm Catheter type: closed end flexible Catheter size: 19 Gauge Catheter at skin depth: 15 cm Test dose: negative and 1.5% lidocaine  with Epi 1:200 K  Assessment Sensory level: T4 Events: blood not aspirated, no cerebrospinal fluid, injection not painful, no injection resistance, no paresthesia and negative IV test  Additional Notes First/one attempt Pt. Evaluated and documentation done after procedure finished. Patient identified. Risks/Benefits/Options discussed with patient including but not limited to bleeding, infection, nerve damage, paralysis, failed block, incomplete pain control, headache, blood pressure changes, nausea, vomiting, reactions to medication both or allergic, itching and postpartum back pain. Confirmed with bedside nurse the patient's most recent platelet count. Confirmed with patient that they are not currently taking any anticoagulation, have any bleeding history or any family history of bleeding disorders. Patient expressed understanding and wished to proceed. All questions were answered.  Sterile technique was used throughout the entire procedure. Please see nursing notes for vital signs. Local anesthetic skin weal  injected, loss of resistance to saline obtained, no evident dural puncture CSF or blood. Then, a 25g spinal needle was passed through the epidural, with clear CSF return. Medication given intrathecally as documented. Spinal needle was removed, and then epidural catheter threaded.Test dose was given through epidural catheter and negative prior to continuing to dose epidural or start infusion. Warning signs of high block given to the patient including shortness of breath, tingling/numbness in hands, complete motor block, or any concerning symptoms with instructions to call for help. Patient was given instructions on fall risk and not to get out of bed. All questions and concerns addressed with instructions to call with any issues or inadequate analgesia.    Patient tolerated the insertion well without immediate complications. Reason for block:surgical anesthesia

## 2023-11-22 NOTE — Progress Notes (Addendum)
 RN continually at bedside attempting to get babies on monitor; unable to due to patient habitus and fetal positioning. Mds and ultrasound intermittently checking fetal heart rates by ultrasound. Shivazz MD at bedside at [phone], Lola and Pickens at bedside soon after. Patient advised at 1014 to proceed with c-section; patient declined as she does not want these babies to come early. Mds at bedside and on unit continuing to discuss risks of staying pregnant and discussing c section with patient; answering questions. I continued to prep for c-section in the case that she consented; in communication with OBOR (open in OBOR C in anticipation of patient). After patient consent, taken up to OR in bed immediately and passed care off to [facility] nurse.

## 2023-11-22 NOTE — Transfer of Care (Signed)
 Immediate Anesthesia Transfer of Care Note  Patient: [facility]  Procedure(s) Performed: CESAREAN SECTION, MULTIPLE FETUSES (Abdomen)  Patient Location: PACU  Anesthesia Type:Spinal and Epidural  Level of Consciousness: awake  Airway & Oxygen Therapy: Patient Spontanous Breathing  Post-op Assessment: Report given to RN and Post -op Vital signs reviewed and stable  Post vital signs: Reviewed and stable  Last Vitals:  Vitals Value Taken Time  BP 154/100 11/22/23 14:45  Temp    Pulse 82 11/22/23 14:47  Resp 23 11/22/23 14:47  SpO2 98 % 11/22/23 14:47  Vitals shown include unfiled device data.  Last Pain:  Vitals:   11/22/23 1032  TempSrc: Oral  PainSc:          Complications: No notable events documented.

## 2023-11-23 LAB — RUBELLA SCREEN: Rubella: 4.48 {index} (ref >0.99)

## 2023-11-23 LAB — COMPREHENSIVE METABOLIC PANEL WITH GFR
ALT: 8 U/L (ref 0–44)
ALT: 8 U/L (ref 0–44)
ALT: 6 U/L (ref 0–44)
AST: 33 U/L (ref 15–41)
AST: 28 U/L (ref 15–41)
AST: 24 U/L (ref 15–41)
Albumin: 1.5 g/dL — ABNORMAL LOW (ref 3.5–5.0)
Albumin: <1.5 g/dL — ABNORMAL LOW (ref 3.5–5.0)
Albumin: <1.5 g/dL — ABNORMAL LOW (ref 3.5–5.0)
Alkaline Phosphatase: 78 U/L (ref 38–126)
Alkaline Phosphatase: 77 U/L (ref 38–126)
Alkaline Phosphatase: 74 U/L (ref 38–126)
Anion gap: 11 (ref 5–15)
Anion gap: 12 (ref 5–15)
Anion gap: 11 (ref 5–15)
BUN: 22 mg/dL — ABNORMAL HIGH (ref 6–20)
BUN: 24 mg/dL — ABNORMAL HIGH (ref 6–20)
BUN: 27 mg/dL — ABNORMAL HIGH (ref 6–20)
CO2: 19 mmol/L — ABNORMAL LOW (ref 22–32)
CO2: 19 mmol/L — ABNORMAL LOW (ref 22–32)
CO2: 21 mmol/L — ABNORMAL LOW (ref 22–32)
Calcium: 7.8 mg/dL — ABNORMAL LOW (ref 8.9–10.3)
Calcium: 7.8 mg/dL — ABNORMAL LOW (ref 8.9–10.3)
Calcium: 7.8 mg/dL — ABNORMAL LOW (ref 8.9–10.3)
Chloride: 103 mmol/L (ref 98–111)
Chloride: 101 mmol/L (ref 98–111)
Chloride: 103 mmol/L (ref 98–111)
Creatinine, Ser: 2.95 mg/dL — ABNORMAL HIGH (ref 0.44–1.00)
Creatinine, Ser: 3.27 mg/dL — ABNORMAL HIGH (ref 0.44–1.00)
Creatinine, Ser: 3.62 mg/dL — ABNORMAL HIGH (ref 0.44–1.00)
GFR, Estimated: 20 mL/min — ABNORMAL LOW (ref >60)
GFR, Estimated: 18 mL/min — ABNORMAL LOW (ref >60)
GFR, Estimated: 16 mL/min — ABNORMAL LOW (ref >60)
Glucose, Bld: 125 mg/dL — ABNORMAL HIGH (ref 70–99)
Glucose, Bld: 111 mg/dL — ABNORMAL HIGH (ref 70–99)
Glucose, Bld: 104 mg/dL — ABNORMAL HIGH (ref 70–99)
Potassium: 5.2 mmol/L — ABNORMAL HIGH (ref 3.5–5.1)
Potassium: 4.7 mmol/L (ref 3.5–5.1)
Potassium: 4.9 mmol/L (ref 3.5–5.1)
Sodium: 133 mmol/L — ABNORMAL LOW (ref 135–145)
Sodium: 132 mmol/L — ABNORMAL LOW (ref 135–145)
Sodium: 135 mmol/L (ref 135–145)
Total Bilirubin: 0.6 mg/dL (ref 0.0–1.2)
Total Bilirubin: 0.4 mg/dL (ref 0.0–1.2)
Total Bilirubin: 0.5 mg/dL (ref 0.0–1.2)
Total Protein: 4.6 g/dL — ABNORMAL LOW (ref 6.5–8.1)
Total Protein: 4.5 g/dL — ABNORMAL LOW (ref 6.5–8.1)
Total Protein: 4.4 g/dL — ABNORMAL LOW (ref 6.5–8.1)

## 2023-11-23 LAB — CBC
HCT: 23.3 % — ABNORMAL LOW (ref 36.0–46.0)
HCT: 22.3 % — ABNORMAL LOW (ref 36.0–46.0)
HCT: 22 % — ABNORMAL LOW (ref 36.0–46.0)
Hemoglobin: 7.5 g/dL — ABNORMAL LOW (ref 12.0–15.0)
Hemoglobin: 7.2 g/dL — ABNORMAL LOW (ref 12.0–15.0)
Hemoglobin: 6.8 g/dL — CL (ref 12.0–15.0)
MCH: 25.6 pg — ABNORMAL LOW (ref 26.0–34.0)
MCH: 25.4 pg — ABNORMAL LOW (ref 26.0–34.0)
MCH: 24.9 pg — ABNORMAL LOW (ref 26.0–34.0)
MCHC: 32.2 g/dL (ref 30.0–36.0)
MCHC: 32.3 g/dL (ref 30.0–36.0)
MCHC: 30.9 g/dL (ref 30.0–36.0)
MCV: 79.5 fL — ABNORMAL LOW (ref 80.0–100.0)
MCV: 78.8 fL — ABNORMAL LOW (ref 80.0–100.0)
MCV: 80.6 fL (ref 80.0–100.0)
Platelets: 125 K/uL — ABNORMAL LOW (ref 150–400)
Platelets: 124 K/uL — ABNORMAL LOW (ref 150–400)
Platelets: 121 K/uL — ABNORMAL LOW (ref 150–400)
RBC: 2.93 MIL/uL — ABNORMAL LOW (ref 3.87–5.11)
RBC: 2.83 MIL/uL — ABNORMAL LOW (ref 3.87–5.11)
RBC: 2.73 MIL/uL — ABNORMAL LOW (ref 3.87–5.11)
RDW: 18.2 % — ABNORMAL HIGH (ref 11.5–15.5)
RDW: 18.3 % — ABNORMAL HIGH (ref 11.5–15.5)
RDW: 18.6 % — ABNORMAL HIGH (ref 11.5–15.5)
WBC: 25.5 K/uL — ABNORMAL HIGH (ref 4.0–10.5)
WBC: 23.4 K/uL — ABNORMAL HIGH (ref 4.0–10.5)
WBC: 22.3 K/uL — ABNORMAL HIGH (ref 4.0–10.5)
nRBC: 0 % (ref 0.0–0.2)
nRBC: 0 % (ref 0.0–0.2)
nRBC: 0 % (ref 0.0–0.2)

## 2023-11-23 LAB — PREPARE RBC (CROSSMATCH)

## 2023-11-23 LAB — RPR: RPR Ser Ql: NONREACTIVE

## 2023-11-23 MED ORDER — HYDRALAZINE HCL 20 MG/ML IJ SOLN: 10.0000 mg | INTRAMUSCULAR | Status: DC | PRN

## 2023-11-23 MED ORDER — NIFEDIPINE ER OSMOTIC RELEASE 30 MG PO TB24
30.0000 mg | ORAL_TABLET | Freq: Two times a day (BID) | ORAL | Status: DC
Start: 2023-11-23 — End: 2023-11-23
  Administered 2023-11-23: 30 mg via ORAL
  Filled 2023-11-23: qty 1

## 2023-11-23 MED ORDER — LABETALOL HCL 5 MG/ML IV SOLN
INTRAVENOUS | Status: AC
  Administered 2023-11-23: 20 mg via INTRAVENOUS
  Filled 2023-11-23: qty 4

## 2023-11-23 MED ORDER — LABETALOL HCL 5 MG/ML IV SOLN
40.0000 mg | INTRAVENOUS | Status: DC | PRN
  Administered 2023-11-23 (×2): 40 mg via INTRAVENOUS
  Filled 2023-11-23 (×2): qty 8

## 2023-11-23 MED ORDER — ENOXAPARIN SODIUM 40 MG/0.4ML IJ SOSY
40.0000 mg | PREFILLED_SYRINGE | INTRAMUSCULAR | Status: DC
  Administered 2023-11-23 – 2023-11-26 (×4): 40 mg via SUBCUTANEOUS
  Filled 2023-11-23 (×4): qty 0.4

## 2023-11-23 MED ORDER — NIFEDIPINE ER OSMOTIC RELEASE 60 MG PO TB24
60.0000 mg | ORAL_TABLET | Freq: Two times a day (BID) | ORAL | Status: DC
  Administered 2023-11-23 – 2023-11-28 (×10): 60 mg via ORAL
  Filled 2023-11-23 (×10): qty 1

## 2023-11-23 MED ORDER — NIFEDIPINE ER OSMOTIC RELEASE 30 MG PO TB24
30.0000 mg | ORAL_TABLET | Freq: Once | ORAL | Status: AC
Start: 2023-11-23 — End: 2023-11-23
  Administered 2023-11-23: 30 mg via ORAL
  Filled 2023-11-23: qty 1

## 2023-11-23 MED ORDER — LABETALOL HCL 5 MG/ML IV SOLN
20.0000 mg | INTRAVENOUS | Status: DC | PRN
  Administered 2023-11-23 (×2): 20 mg via INTRAVENOUS
  Filled 2023-11-23 (×2): qty 4

## 2023-11-23 MED ORDER — SODIUM CHLORIDE 0.9 % IV BOLUS
250.0000 mL | Freq: Once | INTRAVENOUS | Status: AC
  Administered 2023-11-23: 250 mL via INTRAVENOUS

## 2023-11-23 MED ORDER — OXYCODONE HCL 5 MG PO TABS
5.0000 mg | ORAL_TABLET | ORAL | Status: DC | PRN
  Administered 2023-11-23 – 2023-11-24 (×3): 10 mg via ORAL
  Filled 2023-11-23 (×3): qty 2

## 2023-11-23 MED ORDER — LABETALOL HCL 5 MG/ML IV SOLN: 80.0000 mg | INTRAVENOUS | Status: DC | PRN

## 2023-11-23 MED ORDER — SODIUM CHLORIDE 0.9% IV SOLUTION: Freq: Once | INTRAVENOUS | Status: AC

## 2023-11-23 NOTE — Lactation Note (Signed)
 This note was copied from a baby's chart.  NICU Lactation Consultation Note  Patient Name: Glenda Romero Holan Today's Date: 11/23/2023 Age:39 hours  Reason for consult: Initial assessment; NICU baby; Preterm <34wks; Infant < 5lbs; Multiple gestation; Other (Comment) (cHTN, AMA, infrequent PNC)  SUBJECTIVE Visited with family of 35 71/24 weeks old AGA NICU twin female B; Glenda Romero is a P5 and reported she won't be pumping. Extended my condolences for the loss of baby girl A and briefly reviewed lactation suppression. Family was appreciative. FOB present. All questions and concerns answered, family to contact [facility] services PRN.  OBJECTIVE Infant data: Mother's Current Feeding Choice: -- (NPO)  O2 Device: CPAP FiO2 (%): 21 %  Maternal data: [id number] C-Section, Classical Risk factor for low/delayed milk supply:: prematurity, infant separation, < 3 lbs, PPH of 1763 cc, AMA, cHTN  ASSESSMENT Infant: Feeding Status: NPO  Maternal: Not pumping  INTERVENTIONS/PLAN Interventions: Interventions: Education Discharge Education: Engorgement and breast care  Plan: Consult Status: Complete   [place] Blizzard S Darick Fetters 11/23/2023, 4:34 PM

## 2023-11-23 NOTE — Progress Notes (Signed)
 Pt was educated on importance of getting up after having surgery. Pt requested to wait until after she eats breakfast to get up as she is feeling sore.

## 2023-11-23 NOTE — Progress Notes (Signed)
 POSTPARTUM PROGRESS NOTE  Subjective: 38 y.o. [id number] who is POD1 s/p emergenc repeat cesarean section for placental abruption of twins with neonatal demise of twin A.  Course complicated by: Chronic hypertension with preeclampsia, acute kidney injury, oliguria  Reports feeling well, dealing with some postop pain but otherwise good. Reports blurry vision since admission. Denies headache.  Objective: Blood pressure (!) 144/88, pulse 70, temperature 98.1 F (36.7 C), temperature source Oral, resp. rate 20, height 5' 6 (1.676 m), weight (!) 143.4 kg, SpO2 97%, unknown if currently breastfeeding.  Physical Exam:  General: alert, cooperative, and no distress CV: RRR Lungs: CTAB Abdomen: soft, nontender nondistended Uterine Fundus: firm Lochia: appropriate Incision: Prevena in place along vertical skin incision Lower extremities: Calf/Ankle edema is present.  Labs: Lab Results  Component Value Date   WBC 25.5 (H) 11/23/2023   HGB 7.5 (L) 11/23/2023   HCT 23.3 (L) 11/23/2023   MCV 79.5 (L) 11/23/2023   PLT 125 (L) 11/23/2023   Lab Results  Component Value Date   NA 133 (L) 11/23/2023   K 5.2 (H) 11/23/2023   CO2 19 (L) 11/23/2023   GLUCOSE 125 (H) 11/23/2023   BUN 22 (H) 11/23/2023   CREATININE 2.95 (H) 11/23/2023   CALCIUM 7.8 (L) 11/23/2023   GFRNONAA 20 (L) 11/23/2023    Assessment/Plan: Postpartum: routine care  2. Chronic hypertension with superimposed preeclampsia -- procardia 30 mg BID -- mag stopped due to anuria/oliguria overnight with electrolyte abnormalities -- continue serial labs  3. Acute kidney injury: Cr 2.95, suspect 2/2 preeclampsia, continue to trend, expect improvement as patient starts to diurese -- will discuss with nephrology this morning -- nephrotoxic agents held  4. Oliguria: responsive to small boluses overnight, caution with fluids due to third spacing -- continue supportive care  5. Hyperkalemia: Max 5.4, most recently 5.2, s/p  lokelma as recommended by nephrology, continue to trend, expect improvement as she diureses. EKG without obvious signs of hyperkalemia  6. Anemia: unclear if acute/chronic given no baseline but suspect acute on chronic. Hgb 7.5 this morning. She received 2 units pRBCs intra-op. Discussed continued blood transfusion. She hasn't ambulated this morning. Reviewed observing her symptoms with ambulation and lightheaded/dizzy, recommend transfusion, otherwise can observe with serial labs  7. Neonatal: demise of baby A, condolences given, chaplain consulting, supportive care  Rollo ONEIDA Bring, MD, FACOG Obstetrician & Gynecologist, [facility] for [facility], [facility] 11/23/2023, 7:07 AM

## 2023-11-23 NOTE — Progress Notes (Signed)
 Patient calls out to request pain medicine. Patient handed medicine cup with PO dilaudid and takes medication and then states that the medicine makes her too sleepy and she would like for me to ask the doctor for percocet or roxicodone  because that is what she had taken with her other c-sections and would prefer that. Phoned Dr. Cleatus and requested order to change medication.

## 2023-11-23 NOTE — Clinical Social Work Maternal (Signed)
 CLINICAL SOCIAL WORK MATERNAL/CHILD NOTE  Patient Details  Name: Glenda Romero MRN: [id number] Date of Birth: 05-01-1985  Date:  11/23/2023  Clinical Social Worker Initiating Note:  Clayborne Boyd-Gilyard Date/Time: Initiated:  11/23/23/0941     Child's Name:  Undecided at the time of assessment.   Biological Parents:  Mother, Father   Need for Interpreter:      Reason for Referral:  Grief and Loss  , Late or No Prenatal Care     Address:  [address] [place] [address]    Phone number:  [phone] (home)     Additional phone number: FOB's number is [phone]  Household Members/Support Persons (HM/SP):   Household Member/Support Person 1, Household Member/Support Person 2, Household Member/Support Person 3, Household Member/Support Person 4, Household Member/Support Person 5   HM/SP Name Relationship DOB or Age  HM/SP -1 Penne Meyer Husband 11/28/80  HM/SP -2 Znya Sabree dauighter 09/15/11  HM/SP -3 Myrah Barthelemy daughter 09/15/11  HM/SP -4 Ke'Won Hambrick son 11/11/21  HM/SP -5 Anylia Hambrick daughter 12/23/22  HM/SP -6        HM/SP -7        HM/SP -8          Natural Supports (not living in the home):  Parent, Spouse/significant other   Professional Supports: None   Employment: Part-time   Type of Work: Administrator, sports in [place]   Education:  9 to 11 years   Homebound arranged: No (Per MOB, she completed 11th Grade)  Financial Resources:  [employer]   Other Resources:  Sales executive   (MOB communicated she plans to apply for [facility] post discharge.)   Cultural/Religious Considerations Which May Impact Care:  None reported Strengths:  Ability to meet basic needs     Psychotropic Medications:         Pediatrician:       Pediatrician List:   [facility] [place]    [place]      Pediatrician Fax Number:    Risk Factors/Current Problems:  None   Cognitive State:  Able to  Concentrate  , Alert  , Goal Oriented  , Insightful  , Linear Thinking     Mood/Affect:  Calm  , Comfortable  , Happy  , Interested  , Relaxed     CSW Assessment:  CSW met with MOB and FOB at MOB's bedside in room 108. When CSW arrived, MOB was resting in bed and FOB was just entering MOB's room.  CSW introduce self/role, offered support, and completed assessment due to NICU admission. MOB gave CSW verbal consent to complete assessment while FOB was present. The couple was pleasant, easy to engage, and appeared appropriate during the assessment. FOB engaged and appears to be a good support to MOB. The couple seems to have a good understanding of baby's medical situation at this time as they were able to update CSW on her status.   CSW offered condolences for the loss of Twin GirlA. Per MOB they have spoke with someone from the Spiritual Care Dept and no other grief/loss support is needed at this time.    MOB shared her story of labor and delivery as well as baby's admission to NICU and how she felt emotionally throughout her experience. MOB was open to talking with CSW and sharing her feelings. She states baby's admission to NICU was scary and overwhelming. CSW validated  and normalized MOB's thoughts and feelings. CSW assisted her in identifying strengths, which she was able to. She is pleased with baby's progress since NICU admission and happily reported that infant was extubated this morning.   CSW provided education regarding PPD signs and symptoms to watch for and asked that MOB commit to talking with CSW and or her doctor if symptoms arise at any time; MOB agreed. CSW also discussed common emotions often experienced during the first two weeks after delivery, keeping in mind the separation that is inevitably caused by baby's admission to NICU. MOB denies any hx of anx/depression, or PMADs with her older children.  MOB reported she has the support of her mother and husband.   CSW made couple aware  of hospital's policy regarding no/limited PNC.  Per MOB she was unable to receive care in [place] due to the wait time for approval in the state of [place].  MOB denied the use of all illicit substances.  MOB is aware the CSW will monitor infant's UDS and CDS and will make a report to [facility] CPS if warranted. The couple denied having any CPS hx and they expressed not being concerned about infant's drug screens.    SSI information was provided due to infant's birth weight and gestational weeks.    MOB reports not having all needed baby supplies at home infant.  MOB was open to referral to [facility] (CSW made referral). The couple states no issues with transportation. CSW explained ongoing support services offered by NICU CSW and provided contact information. MOB seemed very appreciative of the visit and thanked CSW.   CSW Plan/Description:  Psychosocial Support and Ongoing Assessment of Needs, Perinatal Mood and Anxiety Disorder (PMADs) Education, Other Patient/Family Education, Supplemental Security Income (SSI) Information, Hospital Drug Screen Policy Information, Other Information/Referral to [employer]    CSW will continue to offer resources and supports to family while infant remains in NICU.   Clayborne Hope, MSW, LCSW Clinical Social Work [phone]  CLAYBORNE JONETTA HOPE, LCSW 11/23/2023, 9:50 AM

## 2023-11-24 ENCOUNTER — Inpatient Hospital Stay (HOSPITAL_COMMUNITY)

## 2023-11-24 DIAGNOSIS — N179 Acute kidney failure, unspecified: Secondary | ICD-10-CM | POA: Diagnosis not present

## 2023-11-24 DIAGNOSIS — D62 Acute posthemorrhagic anemia: Secondary | ICD-10-CM | POA: Diagnosis not present

## 2023-11-24 DIAGNOSIS — N183 Chronic kidney disease, stage 3 unspecified: Secondary | ICD-10-CM | POA: Diagnosis not present

## 2023-11-24 DIAGNOSIS — I129 Hypertensive chronic kidney disease with stage 1 through stage 4 chronic kidney disease, or unspecified chronic kidney disease: Secondary | ICD-10-CM | POA: Diagnosis not present

## 2023-11-24 LAB — CBC
HCT: 27.6 % — ABNORMAL LOW (ref 36.0–46.0)
Hemoglobin: 8.9 g/dL — ABNORMAL LOW (ref 12.0–15.0)
MCH: 25.4 pg — ABNORMAL LOW (ref 26.0–34.0)
MCHC: 32.2 g/dL (ref 30.0–36.0)
MCV: 78.9 fL — ABNORMAL LOW (ref 80.0–100.0)
Platelets: 133 K/uL — ABNORMAL LOW (ref 150–400)
RBC: 3.5 MIL/uL — ABNORMAL LOW (ref 3.87–5.11)
RDW: 18.3 % — ABNORMAL HIGH (ref 11.5–15.5)
WBC: 21 K/uL — ABNORMAL HIGH (ref 4.0–10.5)
nRBC: 0 % (ref 0.0–0.2)

## 2023-11-24 LAB — BPAM RBC
Blood Product Expiration Date: 202511112359
Blood Product Expiration Date: 202511122359
Blood Product Expiration Date: 202511122359
Blood Product Expiration Date: 202511142359
ISSUE DATE / TIME: 202510151132
ISSUE DATE / TIME: 202510151132
ISSUE DATE / TIME: 202510161617
ISSUE DATE / TIME: 202510161948
Unit Type and Rh: 7300
Unit Type and Rh: 7300
Unit Type and Rh: 7300
Unit Type and Rh: 7300

## 2023-11-24 LAB — TYPE AND SCREEN
ABO/RH(D): B POS
Antibody Screen: NEGATIVE
Unit division: 0
Unit division: 0
Unit division: 0
Unit division: 0

## 2023-11-24 LAB — URINALYSIS, ROUTINE W REFLEX MICROSCOPIC
Bilirubin Urine: NEGATIVE
Glucose, UA: NEGATIVE mg/dL
Ketones, ur: NEGATIVE mg/dL
Nitrite: NEGATIVE
Protein, ur: 30 mg/dL — AB
RBC / HPF: >50 RBC/hpf (ref 0–5)
Specific Gravity, Urine: 1.009 (ref 1.005–1.030)
pH: 5 (ref 5.0–8.0)

## 2023-11-24 LAB — CREATININE, URINE, RANDOM: Creatinine, Urine: 67 mg/dL

## 2023-11-24 LAB — COMPREHENSIVE METABOLIC PANEL WITH GFR
ALT: <5 U/L (ref 0–44)
AST: 23 U/L (ref 15–41)
Albumin: 1.6 g/dL — ABNORMAL LOW (ref 3.5–5.0)
Alkaline Phosphatase: 77 U/L (ref 38–126)
Anion gap: 11 (ref 5–15)
BUN: 33 mg/dL — ABNORMAL HIGH (ref 6–20)
CO2: 19 mmol/L — ABNORMAL LOW (ref 22–32)
Calcium: 8.3 mg/dL — ABNORMAL LOW (ref 8.9–10.3)
Chloride: 103 mmol/L (ref 98–111)
Creatinine, Ser: 4.31 mg/dL — ABNORMAL HIGH (ref 0.44–1.00)
GFR, Estimated: 13 mL/min — ABNORMAL LOW (ref >60)
Glucose, Bld: 102 mg/dL — ABNORMAL HIGH (ref 70–99)
Potassium: 5.1 mmol/L (ref 3.5–5.1)
Sodium: 133 mmol/L — ABNORMAL LOW (ref 135–145)
Total Bilirubin: 0.4 mg/dL (ref 0.0–1.2)
Total Protein: 5 g/dL — ABNORMAL LOW (ref 6.5–8.1)

## 2023-11-24 LAB — SODIUM, URINE, RANDOM: Sodium, Ur: 70 mmol/L

## 2023-11-24 LAB — CK: Total CK: 249 U/L — ABNORMAL HIGH (ref 38–234)

## 2023-11-24 MED ORDER — LABETALOL HCL 200 MG PO TABS
300.0000 mg | ORAL_TABLET | Freq: Two times a day (BID) | ORAL | Status: DC
  Administered 2023-11-24 – 2023-11-26 (×7): 300 mg via ORAL
  Filled 2023-11-24 (×7): qty 1

## 2023-11-24 MED ORDER — ACETAMINOPHEN 500 MG PO TABS
1000.0000 mg | ORAL_TABLET | Freq: Three times a day (TID) | ORAL | Status: DC
  Administered 2023-11-24 – 2023-11-28 (×12): 1000 mg via ORAL
  Filled 2023-11-24 (×13): qty 2

## 2023-11-24 MED ORDER — ACETAMINOPHEN 500 MG PO TABS: 1000.0000 mg | ORAL_TABLET | Freq: Three times a day (TID) | ORAL | Status: DC

## 2023-11-24 MED ORDER — LABETALOL HCL 200 MG PO TABS: 200.0000 mg | ORAL_TABLET | Freq: Two times a day (BID) | ORAL | Status: DC

## 2023-11-24 MED ORDER — OXYCODONE HCL 5 MG PO TABS
5.0000 mg | ORAL_TABLET | Freq: Four times a day (QID) | ORAL | Status: DC | PRN
Start: 2023-11-24 — End: 2023-11-28
  Administered 2023-11-24 – 2023-11-28 (×12): 5 mg via ORAL
  Filled 2023-11-24 (×13): qty 1

## 2023-11-24 MED ORDER — GABAPENTIN 100 MG PO CAPS
100.0000 mg | ORAL_CAPSULE | Freq: Two times a day (BID) | ORAL | Status: DC
  Administered 2023-11-24 – 2023-11-28 (×9): 100 mg via ORAL
  Filled 2023-11-24 (×9): qty 1

## 2023-11-24 NOTE — Progress Notes (Signed)
 POSTPARTUM PROGRESS NOTE  Subjective: 38 y.o. [id number] who is POD2 s/p emergenc repeat cesarean section for placental abruption of twins with neonatal demise of twin A.  Course complicated by: Chronic hypertension with preeclampsia, acute kidney injury, oliguria  Reports feeling well, dealing with some postop pain but otherwise good. Denies headache.  Objective: Blood pressure 122/71, pulse 75, temperature 97.6 F (36.4 C), temperature source Oral, resp. rate 17, height 5' 6 (1.676 m), weight (!) 143.4 kg, SpO2 96%, unknown if currently breastfeeding.  Physical Exam:  General: alert, cooperative, and no distress CV: RRR Lungs: CTAB Abdomen: soft, nontender nondistended Uterine Fundus: firm Lochia: appropriate Incision: Prevena in place along vertical skin incision Lower extremities: Calf/Ankle edema is present.  Labs: Lab Results  Component Value Date   WBC 21.0 (H) 11/24/2023   HGB 8.9 (L) 11/24/2023   HCT 27.6 (L) 11/24/2023   MCV 78.9 (L) 11/24/2023   PLT 133 (L) 11/24/2023   Lab Results  Component Value Date   NA 133 (L) 11/24/2023   K 5.1 11/24/2023   CO2 19 (L) 11/24/2023   GLUCOSE 102 (H) 11/24/2023   BUN 33 (H) 11/24/2023   CREATININE 4.31 (H) 11/24/2023   CALCIUM 8.3 (L) 11/24/2023   GFRNONAA 13 (L) 11/24/2023    Assessment/Plan: Postpartum: routine care  2. Chronic hypertension with superimposed preeclampsia -- procardia 60 mg BID, Labetalol 300 BID.  -- Mag stopped due to anuria/oliguria overnight with electrolyte abnormalities -- Continue serial labs  3. Acute kidney injury: Cr continues to increase, suspect 2/2 preeclampsia, continue to trend -- Discussed with nephrology and they will evaluate. Appreciate their expertise.  -- Renal US  ordered -- No longer oliguric.  -- Nephrotoxic meds held and meds have been renally adjusted by pharmacy team.    4. Hyperkalemia: Max 5.4, s/p lokelma x1 on 10/16 as recommended by nephrology, EKG without obvious  signs of hyperkalemia. Has resolved since that time.   6. Anemia: unclear if acute/chronic given no baseline but suspect acute on chronic plus acute blood loss anemia from abruption and blood loss in surgery. HgB was 6.8 yesterday on trend so she was given 2 more units of blood (4 total this admission). Hgb this morning is as expected 8.9.    7. Neonatal: demise of baby A, chaplain consulting, supportive care. Baby B in NICU.   Vina Solian, MD, FACOG Obstetrician & Gynecologist, [facility] for [facility], [facility] 11/24/2023, 11:31 AM

## 2023-11-24 NOTE — Consult Note (Addendum)
 Reason for Consult: AKI/CKD stage III Referring Physician: Cleatus, MD  Glenda Romero is an 38 y.o. female with a PMH significant for obesity and HTN (first diagnosed in 2004 and has been on lisinopril) who presented in 3rd trimester of pregnancy for urgent cesarean due to placental abruption on 11/22/23.  Her Bp was 166/98 on presentation.  Her initial labs were notable for Scr of 1.89, Hgb 8, platelets 294, normal liver function test and Hgb A1c of 5.3%.  UA with large blood, 30 protein, small leukocytes.  She recently moved from [place] about a month ago and reports regular care and was last seen by her PCP in September.  She denies any mention of CKD and no family history.  She was not taking any NSAIDs and only lisinopril and prenatal vitamins.  We were consulted due to the development of worsening AKI.  The trend in Scr is seen below.   She reports some nausea but no vomiting or diarrhea.  She denies any dysuria, pyuria, hematuria, urgency, frequency, retention, or foamy/frothy urine.  She has had good UOP since admission.  Trend in Creatinine: Creatinine, Ser  Date/Time Value Ref Range Status  11/24/2023 04:54 AM 4.31 (H) 0.44 - 1.00 mg/dL Final  89/83/7974 97:49 PM 3.62 (H) 0.44 - 1.00 mg/dL Final  89/83/7974 90:59 AM 3.27 (H) 0.44 - 1.00 mg/dL Final  89/83/7974 96:65 AM 2.95 (H) 0.44 - 1.00 mg/dL Final  89/84/7974 91:46 PM 2.43 (H) 0.44 - 1.00 mg/dL Final  89/84/7974 96:54 PM 2.02 (H) 0.44 - 1.00 mg/dL Final  89/84/7974 90:64 AM 1.89 (H) 0.44 - 1.00 mg/dL Final  98/91/7982 98:64 AM 0.86 0.44 - 1.00 mg/dL Final  93/90/7983 88:54 PM 0.86 0.44 - 1.00 mg/dL Final  94/87/7986 98:65 AM 0.57 0.50 - 1.10 mg/dL Final  92/87/7989 96:64 AM 0.81 0.4 - 1.2 mg/dL Final  97/88/7989 90:91 AM 0.62 0.4 - 1.2 mg/dL Final  90/71/7990 88:58 PM 0.73  Final    PMH:   Past Medical History:  Diagnosis Date   Gestational hypertension    Obesity     PSH:   Past Surgical History:  Procedure Laterality  Date   TONSILLECTOMY      Allergies: No Known Allergies  Medications:   Prior to Admission medications   Medication Sig Start Date End Date Taking? Authorizing Provider  azithromycin  (ZITHROMAX ) 250 MG tablet Take 1 tablet (250 mg total) by mouth daily. Take 1 every day until finished beginning tomorrow. You received your first dose in the emergency department 02/17/15   Patel-Mills, Marty, PA-C  benzonatate  (TESSALON ) 100 MG capsule Take 1 capsule (100 mg total) by mouth once. 02/15/15   Charlyn Sora, MD  ibuprofen  (ADVIL ,MOTRIN ) 600 MG tablet Take 1 tablet (600 mg total) by mouth once. 02/15/15   Charlyn Sora, MD  ondansetron  (ZOFRAN -ODT) 8 MG disintegrating tablet Take 1 tablet (8 mg total) by mouth once. 02/15/15   Charlyn Sora, MD  pseudoephedrine  (SUDAFED) 120 MG 12 hr tablet Take 1 tablet (120 mg total) by mouth once. 02/15/15   Charlyn Sora, MD    Inpatient medications:  acetaminophen   1,000 mg Oral Q8H   enoxaparin (LOVENOX) injection  40 mg Subcutaneous Q24H   gabapentin  100 mg Oral BID   labetalol  300 mg Oral BID   NIFEdipine  60 mg Oral BID   polyethylene glycol  17 g Oral QODAY   prenatal multivitamin  1 tablet Oral Q1200   simethicone  80 mg Oral TID PC  Discontinued Meds:   Medications Discontinued During This Encounter  Medication Reason   magnesium bolus via infusion 4 g Duplicate   magnesium sulfate 40 grams in SWI 1000 mL OB infusion Duplicate   sterile water for irrigation Patient Discharge   sodium chloride irrigation 0.9 % Patient Discharge   magnesium sulfate IVPB 1 g 100 mL Duplicate   lactated ringers infusion Patient Transfer   povidone-iodine 10 % swab 2 Application Patient Transfer   labetalol (NORMODYNE) injection 20 mg Patient Transfer   labetalol (NORMODYNE) injection 40 mg Patient Transfer   labetalol (NORMODYNE) injection 80 mg Patient Transfer   hydrALAZINE (APRESOLINE) injection 10 mg Patient Transfer   lactated ringers infusion  Patient Transfer   oxyCODONE  (Oxy IR/ROXICODONE ) immediate release tablet 5 mg Patient Transfer   oxyCODONE  (ROXICODONE ) 5 MG/5ML solution 5 mg Patient Transfer   fentaNYL (SUBLIMAZE) injection 25-50 mcg Patient Transfer   droperidol (INAPSINE) 2.5 MG/ML injection 0.625 mg Patient Transfer   lactated ringers infusion    lactated ringers infusion    ibuprofen  (ADVIL ) tablet 600 mg    magnesium sulfate 40 grams in SWI 1000 mL OB infusion    labetalol (NORMODYNE) injection 20 mg    labetalol (NORMODYNE) injection 40 mg    labetalol (NORMODYNE) injection 80 mg    hydrALAZINE (APRESOLINE) injection 10 mg    ceFAZolin (ANCEF) IVPB 3g/150 mL premix    azithromycin  (ZITHROMAX ) 500 mg in sodium chloride 0.9 % 250 mL IVPB    NIFEdipine (PROCARDIA-XL/NIFEDICAL-XL) 24 hr tablet 30 mg    enoxaparin (LOVENOX) injection 70 mg    NIFEdipine (PROCARDIA-XL/NIFEDICAL-XL) 24 hr tablet 30 mg    HYDROmorphone (DILAUDID) tablet 2-4 mg    gabapentin (NEURONTIN) capsule 300 mg P&T Policy: Renal Dose Adjustment    oxyCODONE  (Oxy IR/ROXICODONE ) immediate release tablet 5-10 mg    acetaminophen  (TYLENOL ) tablet 1,000 mg     Social History:  reports that she has never smoked. She does not have any smokeless tobacco history on file. She reports that she does not drink alcohol and does not use drugs.  Family History:  No family history on file.  Pertinent items are noted in HPI. Weight change:   Intake/Output Summary (Last 24 hours) at 11/24/2023 1404 Last data filed at 11/24/2023 1357 Gross per 24 hour  Intake 1766 ml  Output 2800 ml  Net -1034 ml   BP 120/75 (BP Location: Right Arm)   Pulse 75   Temp (!) 97.3 F (36.3 C) (Oral)   Resp 19   Ht 5' 6 (1.676 m)   Wt (!) 143.4 kg   SpO2 95%   Breastfeeding Unknown   BMI 51.04 kg/m  Vitals:   11/24/23 0334 11/24/23 0417 11/24/23 0802 11/24/23 1140  BP:  (!) 152/92 122/71 120/75  Pulse:  81 75 75  Resp:  20 17 19   Temp:  98 F (36.7 C) 97.6 F  (36.4 C) (!) 97.3 F (36.3 C)  TempSrc:  Oral Oral Oral  SpO2: 95%  96% 95%  Weight:      Height:         General appearance: alert, cooperative, no distress, and moderately obese Head: Normocephalic, without obvious abnormality, atraumatic Resp: clear to auscultation bilaterally Cardio: regular rate and rhythm, S1, S2 normal, no murmur, click, rub or gallop GI: soft, non-tender; bowel sounds normal; no masses,  no organomegaly Extremities: edema 1+ pretibial edema bilaterally  Labs: Basic Metabolic Panel: Recent Labs  Lab 11/22/23 0935 11/22/23 1545 11/22/23 2053  11/23/23 0334 11/23/23 0940 11/23/23 1450 11/24/23 0454  NA 136 135 133* 133* 132* 135 133*  K 4.1 5.1 5.4* 5.2* 4.7 4.9 5.1  CL 106 104 103 103 101 103 103  CO2 16* 21* 19* 19* 19* 21* 19*  GLUCOSE 109* 122* 124* 125* 111* 104* 102*  BUN 17 17 20  22* 24* 27* 33*  CREATININE 1.89* 2.02* 2.43* 2.95* 3.27* 3.62* 4.31*  ALBUMIN 1.9* 1.7* 1.7* 1.5* <1.5* <1.5* 1.6*  CALCIUM 8.1* 7.6* 7.7* 7.8* 7.8* 7.8* 8.3*   Liver Function Tests: Recent Labs  Lab 11/23/23 0940 11/23/23 1450 11/24/23 0454  AST 28 24 23   ALT 8 6 <5  ALKPHOS 77 74 77  BILITOT 0.4 0.5 0.4  PROT 4.5* 4.4* 5.0*  ALBUMIN <1.5* <1.5* 1.6*   No results for input(s): LIPASE, AMYLASE in the last 168 hours. No results for input(s): AMMONIA in the last 168 hours. CBC: Recent Labs  Lab 11/22/23 0935 11/22/23 1035 11/23/23 0334 11/23/23 0940 11/23/23 1450 11/24/23 0454  WBC 18.9*   < > 25.5* 23.4* 22.3* 21.0*  NEUTROABS 15.0*  --   --   --   --   --   HGB 8.1*   < > 7.5* 7.2* 6.8* 8.9*  HCT 27.5*   < > 23.3* 22.3* 22.0* 27.6*  MCV 78.1*   < > 79.5* 78.8* 80.6 78.9*  PLT 294   < > 125* 124* 121* 133*   < > = values in this interval not displayed.   PT/INR: @LABRCNTIP (inr:5) Cardiac Enzymes: )No results for input(s): CKTOTAL, CKMB, CKMBINDEX, TROPONINI in the last 168 hours. CBG: No results for input(s): GLUCAP in the  last 168 hours.  Iron Studies: No results for input(s): IRON, TIBC, TRANSFERRIN, FERRITIN in the last 168 hours.  Xrays/Other Studies: No results found.   Assessment/Plan:  AKI - unknown baseline.  Unclear etiology at this point but possibly related to preeclampsia.  She did have some ABLA and variable bp's.  No nephrotoxic agents noted.  UA with blood, leukocytes, and protein.  Renal US  pending.  No uremic symptoms or indication for dialysis at this time.  Will start GN workup and continue to follow UOP and Scr.   Avoid nephrotoxic medications including NSAIDs and iodinated intravenous contrast exposure unless the latter is absolutely indicated.   Preferred narcotic agents for pain control are hydromorphone, fentanyl, and methadone. Morphine should not be used.  Avoid Baclofen and avoid oral sodium phosphate and magnesium citrate based laxatives / bowel preps.  Continue strict Input and Output monitoring. Will monitor the patient closely with you and intervene or adjust therapy as indicated by changes in clinical status/labs   HTN with superimposed preeclampsia- bp variable, off of lisinopril and now on IV labetalol prn, hydralazine 10 mg iv prn, and nifedipine 60 mg bid.  Ok to start po labetalol 300 mg bid and follow bp and HR.  Continue to hold lisinopril. ABLA - s/p blood transfusion. Hyperkalemia - resolved with lokelma.   Glenda Romero LABOR Surina Storts 11/24/2023, 2:04 PM

## 2023-11-25 ENCOUNTER — Encounter (HOSPITAL_COMMUNITY): Payer: Self-pay | Admitting: Obstetrics and Gynecology

## 2023-11-25 DIAGNOSIS — Z86718 Personal history of other venous thrombosis and embolism: Secondary | ICD-10-CM

## 2023-11-25 DIAGNOSIS — I129 Hypertensive chronic kidney disease with stage 1 through stage 4 chronic kidney disease, or unspecified chronic kidney disease: Secondary | ICD-10-CM | POA: Diagnosis not present

## 2023-11-25 DIAGNOSIS — N183 Chronic kidney disease, stage 3 unspecified: Secondary | ICD-10-CM | POA: Diagnosis not present

## 2023-11-25 DIAGNOSIS — I82532 Chronic embolism and thrombosis of left popliteal vein: Secondary | ICD-10-CM | POA: Diagnosis present

## 2023-11-25 DIAGNOSIS — D62 Acute posthemorrhagic anemia: Secondary | ICD-10-CM | POA: Diagnosis not present

## 2023-11-25 LAB — COMPREHENSIVE METABOLIC PANEL WITH GFR
ALT: <5 U/L (ref 0–44)
AST: 16 U/L (ref 15–41)
Albumin: <1.5 g/dL — ABNORMAL LOW (ref 3.5–5.0)
Alkaline Phosphatase: 76 U/L (ref 38–126)
Anion gap: 12 (ref 5–15)
BUN: 37 mg/dL — ABNORMAL HIGH (ref 6–20)
CO2: 20 mmol/L — ABNORMAL LOW (ref 22–32)
Calcium: 8.3 mg/dL — ABNORMAL LOW (ref 8.9–10.3)
Chloride: 102 mmol/L (ref 98–111)
Creatinine, Ser: 4.39 mg/dL — ABNORMAL HIGH (ref 0.44–1.00)
GFR, Estimated: 12 mL/min — ABNORMAL LOW (ref >60)
Glucose, Bld: 87 mg/dL (ref 70–99)
Potassium: 4.9 mmol/L (ref 3.5–5.1)
Sodium: 134 mmol/L — ABNORMAL LOW (ref 135–145)
Total Bilirubin: 0.5 mg/dL (ref 0.0–1.2)
Total Protein: 4.8 g/dL — ABNORMAL LOW (ref 6.5–8.1)

## 2023-11-25 LAB — CBC
HCT: 26.1 % — ABNORMAL LOW (ref 36.0–46.0)
Hemoglobin: 8.3 g/dL — ABNORMAL LOW (ref 12.0–15.0)
MCH: 25.6 pg — ABNORMAL LOW (ref 26.0–34.0)
MCHC: 31.8 g/dL (ref 30.0–36.0)
MCV: 80.6 fL (ref 80.0–100.0)
Platelets: 150 K/uL (ref 150–400)
RBC: 3.24 MIL/uL — ABNORMAL LOW (ref 3.87–5.11)
RDW: 18.7 % — ABNORMAL HIGH (ref 11.5–15.5)
WBC: 15.3 K/uL — ABNORMAL HIGH (ref 4.0–10.5)
nRBC: 0 % (ref 0.0–0.2)

## 2023-11-25 NOTE — Progress Notes (Signed)
 CSW received consult due to MOB's answers related to social determinants of health questionnaire. CSW met with MOB at bedside to assess for needs and provide support. When CSW entered the room, MOB was observed laying in hospital bed. CSW introduced self and explained reason for visit. MOB presented as calm and welcomed CSW visit.   CSW inquired how MOB is feeling emotionally. MOB shared she is feeling okay today, adding that yesterday was hard. MOB added that she is trying to allow herself to grieve while she has time away from her other children. CSW expressed condolences for her loss and provided emotional support. MOB shares she has named Environmental health practitioner and named infant that passed [place].   CSW inquired about resource needs. MOB reports difficulty paying for food at times but shares that she finds ways to ensure her children are fed. MOB receives food stamps and also utilize food pantries as needed. MOB plans to apply for [facility] once discharged from the hospital. CSW placed [facility] referral with MOB's consent. CSW inquired about housing/utility needs. MOB rents her home. MOB reports coming close to her utilities being shut off in the past due to financial strain. CSW provided MOB with utility, rental, and food resources as well as Risk analyst. MOB expressed appreciation and declined additional resource needs at this time.   CSW will continue to provide support to family while infant remains in NICU.  Signed,  Sharyne LOIS Roulette, MSW, LCSWA, LCASA 11/25/2023 4:01 PM

## 2023-11-25 NOTE — Plan of Care (Signed)
  Problem: Education: Goal: Knowledge of disease or condition will improve Outcome: Progressing Goal: Knowledge of the prescribed therapeutic regimen will improve Outcome: Progressing   Problem: Fluid Volume: Goal: Peripheral tissue perfusion will improve Outcome: Progressing   Problem: Clinical Measurements: Goal: Complications related to disease process, condition or treatment will be avoided or minimized Outcome: Progressing   Problem: Education: Goal: Knowledge of General Education information will improve Description: Including pain rating scale, medication(s)/side effects and non-pharmacologic comfort measures Outcome: Progressing   Problem: Health Behavior/Discharge Planning: Goal: Ability to manage health-related needs will improve Outcome: Progressing   Problem: Clinical Measurements: Goal: Ability to maintain clinical measurements within normal limits will improve Outcome: Progressing Goal: Will remain free from infection Outcome: Progressing Goal: Diagnostic test results will improve Outcome: Progressing Goal: Respiratory complications will improve Outcome: Progressing Goal: Cardiovascular complication will be avoided Outcome: Progressing   Problem: Activity: Goal: Risk for activity intolerance will decrease Outcome: Progressing   Problem: Nutrition: Goal: Adequate nutrition will be maintained Outcome: Progressing   Problem: Coping: Goal: Level of anxiety will decrease Outcome: Progressing   Problem: Elimination: Goal: Will not experience complications related to bowel motility Outcome: Progressing Goal: Will not experience complications related to urinary retention Outcome: Progressing   Problem: Pain Managment: Goal: General experience of comfort will improve and/or be controlled Outcome: Progressing   Problem: Safety: Goal: Ability to remain free from injury will improve Outcome: Progressing   Problem: Skin Integrity: Goal: Risk for impaired  skin integrity will decrease Outcome: Progressing   Problem: Education: Goal: Knowledge of the prescribed therapeutic regimen will improve Outcome: Progressing Goal: Understanding of sexual limitations or changes related to disease process or condition will improve Outcome: Progressing Goal: Individualized Educational Video(s) Outcome: Progressing   Problem: Self-Concept: Goal: Communication of feelings regarding changes in body function or appearance will improve Outcome: Progressing   Problem: Skin Integrity: Goal: Demonstration of wound healing without infection will improve Outcome: Progressing   Problem: Education: Goal: Knowledge of condition will improve Outcome: Progressing Goal: Individualized Educational Video(s) Outcome: Progressing Goal: Individualized Newborn Educational Video(s) Outcome: Progressing   Problem: Activity: Goal: Will verbalize the importance of balancing activity with adequate rest periods Outcome: Progressing Goal: Ability to tolerate increased activity will improve Outcome: Progressing   Problem: Coping: Goal: Ability to identify and utilize available resources and services will improve Outcome: Progressing   Problem: Life Cycle: Goal: Chance of risk for complications during the postpartum period will decrease Outcome: Progressing   Problem: Role Relationship: Goal: Ability to demonstrate positive interaction with newborn will improve Outcome: Progressing   Problem: Skin Integrity: Goal: Demonstration of wound healing without infection will improve Outcome: Progressing

## 2023-11-25 NOTE — Progress Notes (Signed)
 POSTPARTUM PROGRESS NOTE  Subjective: 38 y.o. [id number] who is POD3 s/p emergenc repeat cesarean section for placental abruption of twins with neonatal demise of twin A.  Course complicated by: Chronic hypertension with preeclampsia, acute kidney injury, oliguria, ABLA s/p 4 units PRBC  Reports feeling well, dealing with some postop pain but otherwise good. Denies headache. + flatus. Reports making good UOP.   Objective: Blood pressure 123/69, pulse 82, temperature 98 F (36.7 C), temperature source Oral, resp. rate 16, height 5' 6 (1.676 m), weight (!) 143.4 kg, SpO2 95%, unknown if currently breastfeeding.  Physical Exam:  General: alert, cooperative, and no distress CV: RRR Lungs: CTAB Abdomen: soft, nontender nondistended Uterine Fundus: firm Lochia: appropriate Incision: Prevena in place along vertical skin incision Lower extremities: Calf/Ankle edema is present.  Labs: Lab Results  Component Value Date   WBC 15.3 (H) 11/25/2023   HGB 8.3 (L) 11/25/2023   HCT 26.1 (L) 11/25/2023   MCV 80.6 11/25/2023   PLT 150 11/25/2023   Lab Results  Component Value Date   NA 134 (L) 11/25/2023   K 4.9 11/25/2023   CO2 20 (L) 11/25/2023   GLUCOSE 87 11/25/2023   BUN 37 (H) 11/25/2023   CREATININE 4.39 (H) 11/25/2023   CALCIUM 8.3 (L) 11/25/2023   GFRNONAA 12 (L) 11/25/2023    Assessment/Plan: Postpartum: routine care. Rh positive. Rubella Immune. Other PNL reviewed and wnl.   2. Chronic hypertension with superimposed preeclampsia -- procardia 60 mg BID, Labetalol 300 BID.  -- Mag stopped due to anuria/oliguria overnight with electrolyte abnormalities -- Continue serial labs  3. Acute kidney injury: Cr continues to increase, suspect 2/2 preeclampsia, continue to trend -- Nephrology following. Appreciate their input.  -- Creatinine appears to have peaked. Will continue to follow.  -- Renal US  nromal -- No longer oliguric.  -- Nephrotoxic meds held and meds have been  renally adjusted by pharmacy team.   4. Hyperkalemia: Max 5.4, s/p lokelma x1 on 10/16 as recommended by nephrology, EKG without obvious signs of hyperkalemia. Has resolved since that time.   5. Anemia: unclear if acute/chronic given no baseline but suspect acute on chronic plus acute blood loss anemia from abruption and blood loss in surgery. HgB was 6.8 yesterday on trend so she was given 2 more units of blood (4 total this admission). Hgb stable.   6. 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 She was on blood thinners until she moved to [place] and then she ran out of her prescription. She has no specific symptoms at this time. She has been on prophylactic lovenox.  -- Discussed with FM. Will check LLE dopplers. If normal, continue prophylactic lovenox. If residual clot, will check CTPE.   7. Neonatal: demise of baby A, chaplain consulting, supportive care. Baby B in NICU.   Vina Solian, MD, FACOG Obstetrician & Gynecologist, [facility] for [facility], [facility] 11/25/2023, 9:51 AM

## 2023-11-25 NOTE — Progress Notes (Signed)
 Patient ID: Glenda Romero, female   DOB: 11-08-1985, 38 y.o.   MRN: [id number] S: Feels better this morning and reports good UOP.   O:BP 123/69 (BP Location: Right Arm)   Pulse 82   Temp 98 F (36.7 C) (Oral)   Resp 16   Ht 5' 6 (1.676 m)   Wt (!) 143.4 kg   SpO2 95%   Breastfeeding Unknown   BMI 51.04 kg/m   Intake/Output Summary (Last 24 hours) at 11/25/2023 1207 Last data filed at 11/25/2023 0810 Gross per 24 hour  Intake 480 ml  Output 2650 ml  Net -2170 ml   Intake/Output: I/O last 3 completed shifts: In: 1688 [P.O.:1320; Blood:368] Out: 3600 [Urine:3600]  Intake/Output this shift:  Total I/O In: -  Out: 1000 [Urine:1000] Weight change:  Gen: NAD CVS:CTA Resp: +BS, soft, NT/ND  Abd:+BS, soft, NT/ND Ext: 2+ pretibial edema bilaterally  Recent Labs  Lab 11/22/23 1545 11/22/23 2053 11/23/23 0334 11/23/23 0940 11/23/23 1450 11/24/23 0454 11/25/23 0424  NA 135 133* 133* 132* 135 133* 134*  K 5.1 5.4* 5.2* 4.7 4.9 5.1 4.9  CL 104 103 103 101 103 103 102  CO2 21* 19* 19* 19* 21* 19* 20*  GLUCOSE 122* 124* 125* 111* 104* 102* 87  BUN 17 20 22* 24* 27* 33* 37*  CREATININE 2.02* 2.43* 2.95* 3.27* 3.62* 4.31* 4.39*  ALBUMIN 1.7* 1.7* 1.5* <1.5* <1.5* 1.6* <1.5*  CALCIUM 7.6* 7.7* 7.8* 7.8* 7.8* 8.3* 8.3*  AST 24 33 33 28 24 23 16   ALT 9 11 8 8 6  <5 <5   Liver Function Tests: Recent Labs  Lab 11/23/23 1450 11/24/23 0454 11/25/23 0424  AST 24 23 16   ALT 6 <5 <5  ALKPHOS 74 77 76  BILITOT 0.5 0.4 0.5  PROT 4.4* 5.0* 4.8*  ALBUMIN <1.5* 1.6* <1.5*   No results for input(s): LIPASE, AMYLASE in the last 168 hours. No results for input(s): AMMONIA in the last 168 hours. CBC: Recent Labs  Lab 11/22/23 0935 11/22/23 1035 11/23/23 0334 11/23/23 0940 11/23/23 1450 11/24/23 0454 11/25/23 0424  WBC 18.9*   < > 25.5* 23.4* 22.3* 21.0* 15.3*  NEUTROABS 15.0*  --   --   --   --   --   --   HGB 8.1*   < > 7.5* 7.2* 6.8* 8.9* 8.3*  HCT 27.5*   < > 23.3*  22.3* 22.0* 27.6* 26.1*  MCV 78.1*   < > 79.5* 78.8* 80.6 78.9* 80.6  PLT 294   < > 125* 124* 121* 133* 150   < > = values in this interval not displayed.   Cardiac Enzymes: Recent Labs  Lab 11/24/23 1558  CKTOTAL 249*   CBG: No results for input(s): GLUCAP in the last 168 hours.  Iron Studies: No results for input(s): IRON, TIBC, TRANSFERRIN, FERRITIN in the last 72 hours. Studies/Results: US  RENAL Result Date: 11/24/2023 EXAM: US  Retroperitoneum Complete, Renal. CLINICAL HISTORY: 402455 Acute kidney injury 402455. Acute kidney injury. TECHNIQUE: Real-time ultrasound of the retroperitoneum (complete) with image documentation. COMPARISON: None provided. FINDINGS: RIGHT KIDNEY: Right kidney measures 12.7 x 5.2 x 7 cm with a volume of 241.6 ml. Echogenicity is normal. No hydronephrosis, renal stone, or mass visualized. LEFT KIDNEY: Left kidney measures 12.6 x 7.2 x 6.4 cm with a volume of 301.3 ml. Echogenicity is normal. No hydronephrosis, renal stone, or mass visualized. BLADDER: Unremarkable as visualized. IMPRESSION: 1. Negative ultrasound of the kidneys. Electronically signed by: Luke Bun MD 11/24/2023  08:03 PM EDT RP Workstation: [id number]    acetaminophen   1,000 mg Oral Q8H   enoxaparin (LOVENOX) injection  40 mg Subcutaneous Q24H   gabapentin  100 mg Oral BID   labetalol  300 mg Oral BID   NIFEdipine  60 mg Oral BID   polyethylene glycol  17 g Oral QODAY   prenatal multivitamin  1 tablet Oral Q1200   simethicone  80 mg Oral TID PC    BMET    Component Value Date/Time   NA 134 (L) 11/25/2023 0424   K 4.9 11/25/2023 0424   CL 102 11/25/2023 0424   CO2 20 (L) 11/25/2023 0424   GLUCOSE 87 11/25/2023 0424   BUN 37 (H) 11/25/2023 0424   CREATININE 4.39 (H) 11/25/2023 0424   CALCIUM 8.3 (L) 11/25/2023 0424   GFRNONAA 12 (L) 11/25/2023 0424   GFRAA >60 02/15/2015 0135   CBC    Component Value Date/Time   WBC 15.3 (H) 11/25/2023 0424   RBC 3.24 (L)  11/25/2023 0424   HGB 8.3 (L) 11/25/2023 0424   HCT 26.1 (L) 11/25/2023 0424   PLT 150 11/25/2023 0424   MCV 80.6 11/25/2023 0424   MCH 25.6 (L) 11/25/2023 0424   MCHC 31.8 11/25/2023 0424   RDW 18.7 (H) 11/25/2023 0424   LYMPHSABS 2.7 11/22/2023 0935   MONOABS 1.1 (H) 11/22/2023 0935   EOSABS 0.0 11/22/2023 0935   BASOSABS 0.0 11/22/2023 0935    Assessment/Plan:  AKI - unknown baseline.  Unclear etiology at this point but possibly related to preeclampsia induced ATN (given placental abruption), also with significant proteinuria and hypoalbuminemia.  Platelets werer initially low but improving and LFT's WNL.  She did have some ABLA and variable bp's.  No nephrotoxic agents noted.  UA with blood, leukocytes, and protein.  Renal US  unremarkable.  No uremic symptoms or indication for dialysis at this time.  Started GN workup and continue to follow UOP and Scr.  Will also order 24 hour urine protein.  She may need a kidney biopsy if Scr continues to worsen and pending serologies. Avoid nephrotoxic medications including NSAIDs and iodinated intravenous contrast exposure unless the latter is absolutely indicated.   Preferred narcotic agents for pain control are hydromorphone, fentanyl, and methadone. Morphine should not be used.  Avoid Baclofen and avoid oral sodium phosphate and magnesium citrate based laxatives / bowel preps.  Continue strict Input and Output monitoring. Will monitor the patient closely with you and intervene or adjust therapy as indicated by changes in clinical status/labs   HTN with superimposed preeclampsia- bp variable, off of lisinopril and now on IV labetalol prn, hydralazine 10 mg iv prn, and nifedipine 60 mg bid.  Ok to start po labetalol 300 mg bid and follow bp and HR.  Continue to hold lisinopril. ABLA - s/p blood transfusion. Hyperkalemia - resolved with lokelma.  Fairy RONAL Sellar, MD [facility]

## 2023-11-26 ENCOUNTER — Inpatient Hospital Stay (HOSPITAL_COMMUNITY)

## 2023-11-26 ENCOUNTER — Encounter (HOSPITAL_COMMUNITY): Payer: Self-pay | Admitting: Obstetrics and Gynecology

## 2023-11-26 DIAGNOSIS — D62 Acute posthemorrhagic anemia: Secondary | ICD-10-CM | POA: Diagnosis not present

## 2023-11-26 DIAGNOSIS — M7989 Other specified soft tissue disorders: Secondary | ICD-10-CM | POA: Diagnosis not present

## 2023-11-26 DIAGNOSIS — N183 Chronic kidney disease, stage 3 unspecified: Secondary | ICD-10-CM | POA: Diagnosis not present

## 2023-11-26 DIAGNOSIS — I129 Hypertensive chronic kidney disease with stage 1 through stage 4 chronic kidney disease, or unspecified chronic kidney disease: Secondary | ICD-10-CM | POA: Diagnosis not present

## 2023-11-26 LAB — CBC
HCT: 24 % — ABNORMAL LOW (ref 36.0–46.0)
Hemoglobin: 7.5 g/dL — ABNORMAL LOW (ref 12.0–15.0)
MCH: 25.9 pg — ABNORMAL LOW (ref 26.0–34.0)
MCHC: 31.3 g/dL (ref 30.0–36.0)
MCV: 82.8 fL (ref 80.0–100.0)
Platelets: 183 K/uL (ref 150–400)
RBC: 2.9 MIL/uL — ABNORMAL LOW (ref 3.87–5.11)
RDW: 18.8 % — ABNORMAL HIGH (ref 11.5–15.5)
WBC: 14.1 K/uL — ABNORMAL HIGH (ref 4.0–10.5)
nRBC: 0 % (ref 0.0–0.2)

## 2023-11-26 LAB — COMPREHENSIVE METABOLIC PANEL WITH GFR
ALT: 7 U/L (ref 0–44)
AST: 20 U/L (ref 15–41)
Albumin: <1.5 g/dL — ABNORMAL LOW (ref 3.5–5.0)
Alkaline Phosphatase: 70 U/L (ref 38–126)
Anion gap: 10 (ref 5–15)
BUN: 37 mg/dL — ABNORMAL HIGH (ref 6–20)
CO2: 21 mmol/L — ABNORMAL LOW (ref 22–32)
Calcium: 8.1 mg/dL — ABNORMAL LOW (ref 8.9–10.3)
Chloride: 104 mmol/L (ref 98–111)
Creatinine, Ser: 3.97 mg/dL — ABNORMAL HIGH (ref 0.44–1.00)
GFR, Estimated: 14 mL/min — ABNORMAL LOW (ref >60)
Glucose, Bld: 92 mg/dL (ref 70–99)
Potassium: 4.4 mmol/L (ref 3.5–5.1)
Sodium: 135 mmol/L (ref 135–145)
Total Bilirubin: 0.4 mg/dL (ref 0.0–1.2)
Total Protein: 4.7 g/dL — ABNORMAL LOW (ref 6.5–8.1)

## 2023-11-26 LAB — EXTRACTABLE NUCLEAR ANTIGEN ANTIBODY
ENA SM Ab Ser-aCnc: <0.2 AI (ref 0.0–0.9)
Ribonucleic Protein: <0.2 AI (ref 0.0–0.9)
SSA (Ro) (ENA) Antibody, IgG: <0.2 AI (ref 0.0–0.9)
SSB (La) (ENA) Antibody, IgG: <0.2 AI (ref 0.0–0.9)
Scleroderma (Scl-70) (ENA) Antibody, IgG: <0.2 AI (ref 0.0–0.9)
ds DNA Ab: 2 [IU]/mL (ref 0–9)

## 2023-11-26 LAB — ANTI-JO 1 ANTIBODY, IGG: Anti JO-1: <0.2 AI (ref 0.0–0.9)

## 2023-11-26 LAB — ANA W/REFLEX IF POSITIVE: Anti Nuclear Antibody (ANA): NEGATIVE

## 2023-11-26 LAB — PREPARE RBC (CROSSMATCH)

## 2023-11-26 MED ORDER — ENOXAPARIN SODIUM 150 MG/ML IJ SOSY
140.0000 mg | PREFILLED_SYRINGE | INTRAMUSCULAR | Status: DC
  Administered 2023-11-27: 140 mg via SUBCUTANEOUS
  Filled 2023-11-26: qty 0.94

## 2023-11-26 MED ORDER — ENOXAPARIN SODIUM 100 MG/ML IJ SOSY
100.0000 mg | PREFILLED_SYRINGE | Freq: Once | INTRAMUSCULAR | Status: AC
  Administered 2023-11-26: 100 mg via SUBCUTANEOUS
  Filled 2023-11-26: qty 1

## 2023-11-26 NOTE — Progress Notes (Addendum)
 Patient ID: Glenda Romero, female   DOB: [birth year], 38 y.o.   MRN: [id number] S: Feeling better and improved Bp and UOP.  Edema also improving. O:BP 123/68 (BP Location: Right Arm)   Pulse 88   Temp 98.4 F (36.9 C) (Oral)   Resp 18   Ht 5' 6 (1.676 m)   Wt (!) 143.4 kg   SpO2 94%   Breastfeeding Unknown   BMI 51.04 kg/m   Intake/Output Summary (Last 24 hours) at 11/26/2023 1234 Last data filed at 11/26/2023 1100 Gross per 24 hour  Intake 7080 ml  Output 5825 ml  Net 1255 ml   Intake/Output: I/O last 3 completed shifts: In: 6120 [P.O.:6120] Out: 5825 [Urine:5825]  Intake/Output this shift:  Total I/O In: 1200 [P.O.:1200] Out: 1500 [Urine:1500] Weight change:  Gen: NAD CVS: RRR Resp:CTA Abd: obese, +BS, soft, NT Ext: 1+ pretibial edema  Recent Labs  Lab 11/22/23 2053 11/23/23 0334 11/23/23 0940 11/23/23 1450 11/24/23 0454 11/25/23 0424 11/26/23 0430  NA 133* 133* 132* 135 133* 134* 135  K 5.4* 5.2* 4.7 4.9 5.1 4.9 4.4  CL 103 103 101 103 103 102 104  CO2 19* 19* 19* 21* 19* 20* 21*  GLUCOSE 124* 125* 111* 104* 102* 87 92  BUN 20 22* 24* 27* 33* 37* 37*  CREATININE 2.43* 2.95* 3.27* 3.62* 4.31* 4.39* 3.97*  ALBUMIN 1.7* 1.5* <1.5* <1.5* 1.6* <1.5* <1.5*  CALCIUM 7.7* 7.8* 7.8* 7.8* 8.3* 8.3* 8.1*  AST 33 33 28 24 23 16 20   ALT 11 8 8 6  <5 <5 7   Liver Function Tests: Recent Labs  Lab 11/24/23 0454 11/25/23 0424 11/26/23 0430  AST 23 16 20   ALT <5 <5 7  ALKPHOS 77 76 70  BILITOT 0.4 0.5 0.4  PROT 5.0* 4.8* 4.7*  ALBUMIN 1.6* <1.5* <1.5*   No results for input(s): LIPASE, AMYLASE in the last 168 hours. No results for input(s): AMMONIA in the last 168 hours. CBC: Recent Labs  Lab 11/22/23 0935 11/22/23 1035 11/23/23 0940 11/23/23 1450 11/24/23 0454 11/25/23 0424 11/26/23 0430  WBC 18.9*   < > 23.4* 22.3* 21.0* 15.3* 14.1*  NEUTROABS 15.0*  --   --   --   --   --   --   HGB 8.1*   < > 7.2* 6.8* 8.9* 8.3* 7.5*  HCT 27.5*   < > 22.3*  22.0* 27.6* 26.1* 24.0*  MCV 78.1*   < > 78.8* 80.6 78.9* 80.6 82.8  PLT 294   < > 124* 121* 133* 150 183   < > = values in this interval not displayed.   Cardiac Enzymes: Recent Labs  Lab 11/24/23 1558  CKTOTAL 249*   CBG: No results for input(s): GLUCAP in the last 168 hours.  Iron Studies: No results for input(s): IRON, TIBC, TRANSFERRIN, FERRITIN in the last 72 hours. Studies/Results: US  RENAL Result Date: 11/24/2023 EXAM: US  Retroperitoneum Complete, Renal. CLINICAL HISTORY: 402455 Acute kidney injury 402455. Acute kidney injury. TECHNIQUE: Real-time ultrasound of the retroperitoneum (complete) with image documentation. COMPARISON: None provided. FINDINGS: RIGHT KIDNEY: Right kidney measures 12.7 x 5.2 x 7 cm with a volume of 241.6 ml. Echogenicity is normal. No hydronephrosis, renal stone, or mass visualized. LEFT KIDNEY: Left kidney measures 12.6 x 7.2 x 6.4 cm with a volume of 301.3 ml. Echogenicity is normal. No hydronephrosis, renal stone, or mass visualized. BLADDER: Unremarkable as visualized. IMPRESSION: 1. Negative ultrasound of the kidneys. Electronically signed by: Luke Bun MD 11/24/2023 08:03 PM  EDT RP Workstation: [id number]    acetaminophen   1,000 mg Oral Q8H   enoxaparin (LOVENOX) injection  100 mg Subcutaneous Once   [START ON 11/27/2023] enoxaparin (LOVENOX) injection  140 mg Subcutaneous Q24H   gabapentin  100 mg Oral BID   labetalol  300 mg Oral BID   NIFEdipine  60 mg Oral BID   polyethylene glycol  17 g Oral QODAY   prenatal multivitamin  1 tablet Oral Q1200   simethicone  80 mg Oral TID PC    BMET    Component Value Date/Time   NA 135 11/26/2023 0430   K 4.4 11/26/2023 0430   CL 104 11/26/2023 0430   CO2 21 (L) 11/26/2023 0430   GLUCOSE 92 11/26/2023 0430   BUN 37 (H) 11/26/2023 0430   CREATININE 3.97 (H) 11/26/2023 0430   CALCIUM 8.1 (L) 11/26/2023 0430   GFRNONAA 14 (L) 11/26/2023 0430   GFRAA >60 02/15/2015 0135   CBC     Component Value Date/Time   WBC 14.1 (H) 11/26/2023 0430   RBC 2.90 (L) 11/26/2023 0430   HGB 7.5 (L) 11/26/2023 0430   HCT 24.0 (L) 11/26/2023 0430   PLT 183 11/26/2023 0430   MCV 82.8 11/26/2023 0430   MCH 25.9 (L) 11/26/2023 0430   MCHC 31.3 11/26/2023 0430   RDW 18.8 (H) 11/26/2023 0430   LYMPHSABS 2.7 11/22/2023 0935   MONOABS 1.1 (H) 11/22/2023 0935   EOSABS 0.0 11/22/2023 0935   BASOSABS 0.0 11/22/2023 0935    Assessment/Plan:  AKI - unknown baseline.  Unclear etiology at this point but possibly related to preeclampsia induced ATN (given placental abruption), also with significant proteinuria and hypoalbuminemia.  Platelets werer initially low but improving and LFT's WNL.  She did have some ABLA and variable bp's.  No nephrotoxic agents noted.  UA with blood, leukocytes, and protein.  Renal US  unremarkable.  No uremic symptoms or indication for dialysis at this time.  Started GN workup and continue to follow UOP and Scr.  Will also order 24 hour urine protein.  She may need a kidney biopsy if Scr continues to worsen and pending serologies.  ANA and lupus panel negative.  Complements and ANCA and ASO pending.  Good UOP and Scr is starting to improve.  Albumin <1.5 so could explain her AKI due to ATN in setting of nephrotic syndrome.   Avoid nephrotoxic medications including NSAIDs and iodinated intravenous contrast exposure unless the latter is absolutely indicated.   Preferred narcotic agents for pain control are hydromorphone, fentanyl, and methadone. Morphine should not be used.  Avoid Baclofen and avoid oral sodium phosphate and magnesium citrate based laxatives / bowel preps.  Continue strict Input and Output monitoring. Will monitor the patient closely with you and intervene or adjust therapy as indicated by changes in clinical status/labs   HTN with superimposed preeclampsia- bp variable, off of lisinopril and now on IV labetalol prn, hydralazine 10 mg iv prn, and nifedipine 60  mg bid.  Ok to start po labetalol 300 mg bid and follow bp and HR.  Continue to hold lisinopril.  Bp's improving. Nephrotic syndrome - as above, possibly due to pre-eclampsia.  May need kidney biopsy in the near future if no improvement. ABLA - s/p blood transfusion.  Follow H/H and transfuse as needed. Hyperkalemia - resolved with lokelma.  Fairy RONAL Sellar, MD [facility]

## 2023-11-26 NOTE — Plan of Care (Signed)
  Problem: Education: Goal: Knowledge of disease or condition will improve Outcome: Progressing Goal: Knowledge of the prescribed therapeutic regimen will improve Outcome: Progressing   Problem: Fluid Volume: Goal: Peripheral tissue perfusion will improve Outcome: Progressing   Problem: Clinical Measurements: Goal: Complications related to disease process, condition or treatment will be avoided or minimized Outcome: Progressing   Problem: Education: Goal: Knowledge of General Education information will improve Description: Including pain rating scale, medication(s)/side effects and non-pharmacologic comfort measures Outcome: Progressing   Problem: Health Behavior/Discharge Planning: Goal: Ability to manage health-related needs will improve Outcome: Progressing   Problem: Clinical Measurements: Goal: Ability to maintain clinical measurements within normal limits will improve Outcome: Progressing Goal: Will remain free from infection Outcome: Progressing Goal: Diagnostic test results will improve Outcome: Progressing Goal: Respiratory complications will improve Outcome: Progressing Goal: Cardiovascular complication will be avoided Outcome: Progressing   Problem: Activity: Goal: Risk for activity intolerance will decrease Outcome: Progressing   Problem: Nutrition: Goal: Adequate nutrition will be maintained Outcome: Progressing   Problem: Coping: Goal: Level of anxiety will decrease Outcome: Progressing   Problem: Elimination: Goal: Will not experience complications related to bowel motility Outcome: Progressing Goal: Will not experience complications related to urinary retention Outcome: Progressing   Problem: Pain Managment: Goal: General experience of comfort will improve and/or be controlled Outcome: Progressing   Problem: Safety: Goal: Ability to remain free from injury will improve Outcome: Progressing   Problem: Skin Integrity: Goal: Risk for impaired  skin integrity will decrease Outcome: Progressing   Problem: Education: Goal: Knowledge of the prescribed therapeutic regimen will improve Outcome: Progressing Goal: Understanding of sexual limitations or changes related to disease process or condition will improve Outcome: Progressing Goal: Individualized Educational Video(s) Outcome: Progressing   Problem: Self-Concept: Goal: Communication of feelings regarding changes in body function or appearance will improve Outcome: Progressing   Problem: Skin Integrity: Goal: Demonstration of wound healing without infection will improve Outcome: Progressing   Problem: Education: Goal: Knowledge of condition will improve Outcome: Progressing Goal: Individualized Educational Video(s) Outcome: Progressing Goal: Individualized Newborn Educational Video(s) Outcome: Progressing   Problem: Activity: Goal: Will verbalize the importance of balancing activity with adequate rest periods Outcome: Progressing Goal: Ability to tolerate increased activity will improve Outcome: Progressing   Problem: Coping: Goal: Ability to identify and utilize available resources and services will improve Outcome: Progressing   Problem: Life Cycle: Goal: Chance of risk for complications during the postpartum period will decrease Outcome: Progressing   Problem: Role Relationship: Goal: Ability to demonstrate positive interaction with newborn will improve Outcome: Progressing   Problem: Skin Integrity: Goal: Demonstration of wound healing without infection will improve Outcome: Progressing

## 2023-11-26 NOTE — Progress Notes (Signed)
 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 Will continue with therapeutic Lovenox as already ordered.   GLORIS HUGGER, MD, FACOG Obstetrician & Gynecologist, [facility] for [employer], [facility]

## 2023-11-26 NOTE — Progress Notes (Signed)
 Venous duplex lower ext.  has been completed. Refer to [facility] under chart review to view preliminary results.   11/26/2023  3:45 PM Glenda Romero, [name]

## 2023-11-26 NOTE — Progress Notes (Signed)
 LLE doppler study  have been difficult to arrange for patient today. Concerned about her high risk for current VTE, CT chest not ideal due to increased Cr. Will presumptively treat with therapeutic Lovenox dosing for VTE, follow up LLE doppler study when this is done.  If Cr improves enough, may consider CT chest to evaluate for PE. Discussed with pharmacy, will start Lovenox 140 mg [place] daily.  Patient's hemoglobin also returned at 7.5, and she reports SOB, significant dizziness when getting up from bed and ambulating.  Will transfuse with 2 pRBCs, recheck hemoglobin in morning.   GLORIS HUGGER, MD, FACOG Obstetrician & Gynecologist, [facility] for [employer], [facility]

## 2023-11-26 NOTE — Progress Notes (Signed)
 POSTPARTUM PROGRESS NOTE  Subjective: 38 y.o. [id number] who is POD4 s/p emergenc repeat cesarean section for placental abruption of twins with neonatal demise of twin A.  Course complicated by: Chronic hypertension with preeclampsia, acute kidney injury, oliguria, ABLA s/p 4 units PRBC  Reports feeling well, dealing with some postop pain but otherwise good. Denies headache. + flatus. Reports making good UOP.   Objective: Blood pressure (!) 147/84, pulse 91, temperature 98.3 F (36.8 C), temperature source Oral, resp. rate 18, height 5' 6 (1.676 m), weight (!) 143.4 kg, SpO2 97%, unknown if currently breastfeeding.  Physical Exam:  General: alert, cooperative, and no distress CV: RRR Lungs: CTAB Abdomen: soft, nontender nondistended Uterine Fundus: firm Lochia: appropriate Incision: Prevena in place along vertical skin incision Lower extremities: Calf/Ankle edema is present.  Labs: Lab Results  Component Value Date   WBC 14.1 (H) 11/26/2023   HGB 7.5 (L) 11/26/2023   HCT 24.0 (L) 11/26/2023   MCV 82.8 11/26/2023   PLT 183 11/26/2023   Lab Results  Component Value Date   NA 135 11/26/2023   K 4.4 11/26/2023   CO2 21 (L) 11/26/2023   GLUCOSE 92 11/26/2023   BUN 37 (H) 11/26/2023   CREATININE 3.97 (H) 11/26/2023   CALCIUM 8.1 (L) 11/26/2023   GFRNONAA 14 (L) 11/26/2023    Assessment/Plan: Postpartum: routine care. Rh positive. Rubella Immune. Other PNL reviewed and wnl.   2. Chronic hypertension with superimposed preeclampsia -- procardia 60 mg BID, Labetalol 300 BID.  Most Bps normal - would not adjust.  -- Mag stopped due to anuria/oliguria overnight with electrolyte abnormalities -- Continue serial labs  3. Acute kidney injury: Cr continues to increase, suspect 2/2 preeclampsia, continue to trend -- Nephrology following. Appreciate their input.  -- Creatinine starting to improve.  -- Renal US  normal -- No longer oliguric.  -- Nephrotoxic meds held and meds have  been renally adjusted by pharmacy team.   4. Hyperkalemia: Max 5.4, s/p lokelma x1 on 10/16 as recommended by nephrology, EKG without obvious signs of hyperkalemia. Has resolved since that time.   5. Anemia: unclear if acute/chronic given no baseline but suspect acute on chronic plus acute blood loss anemia from abruption and blood loss in surgery. HgB was 6.8 on trend so she was given 2 more units of blood (4 total this admission). Hgb stable at 7.5.   6. H/o DVT -- During exam on 10/18, she told me about DVT in LLE that she was diagnosed with 6 months ago. She was on blood thinners until she moved to [place] and then she ran out of her prescription. She has no specific symptoms at this time. She has been on prophylactic lovenox.  -- Discussed with FM. Will check LLE dopplers. If normal, continue prophylactic lovenox. If residual clot, will check CTPE but goal is to avoid contrast while kidneys recovering.   7. Neonatal: demise of baby A, chaplain consulting, supportive care. Baby B in NICU.   Vina Solian, MD, FACOG Obstetrician & Gynecologist, [facility] for [facility], [facility] 11/26/2023, 9:43 AM

## 2023-11-27 ENCOUNTER — Inpatient Hospital Stay (HOSPITAL_COMMUNITY)

## 2023-11-27 ENCOUNTER — Telehealth (HOSPITAL_COMMUNITY): Payer: Self-pay

## 2023-11-27 ENCOUNTER — Other Ambulatory Visit (HOSPITAL_COMMUNITY): Payer: Self-pay

## 2023-11-27 DIAGNOSIS — N179 Acute kidney failure, unspecified: Secondary | ICD-10-CM | POA: Diagnosis not present

## 2023-11-27 DIAGNOSIS — O4592 Premature separation of placenta, unspecified, second trimester: Secondary | ICD-10-CM

## 2023-11-27 DIAGNOSIS — I129 Hypertensive chronic kidney disease with stage 1 through stage 4 chronic kidney disease, or unspecified chronic kidney disease: Secondary | ICD-10-CM | POA: Diagnosis not present

## 2023-11-27 DIAGNOSIS — O2230 Deep phlebothrombosis in pregnancy, unspecified trimester: Secondary | ICD-10-CM | POA: Diagnosis not present

## 2023-11-27 DIAGNOSIS — Z0389 Encounter for observation for other suspected diseases and conditions ruled out: Secondary | ICD-10-CM | POA: Diagnosis not present

## 2023-11-27 DIAGNOSIS — D62 Acute posthemorrhagic anemia: Secondary | ICD-10-CM | POA: Diagnosis not present

## 2023-11-27 DIAGNOSIS — N183 Chronic kidney disease, stage 3 unspecified: Secondary | ICD-10-CM | POA: Diagnosis not present

## 2023-11-27 DIAGNOSIS — Z3A27 27 weeks gestation of pregnancy: Secondary | ICD-10-CM

## 2023-11-27 LAB — BPAM RBC
Blood Product Expiration Date: 202511202359
Blood Product Expiration Date: 202511212359
ISSUE DATE / TIME: 202510191441
ISSUE DATE / TIME: 202510191816
Unit Type and Rh: 7300
Unit Type and Rh: 7300

## 2023-11-27 LAB — COMPREHENSIVE METABOLIC PANEL WITH GFR
ALT: 6 U/L (ref 0–44)
AST: 15 U/L (ref 15–41)
Albumin: 1.6 g/dL — ABNORMAL LOW (ref 3.5–5.0)
Alkaline Phosphatase: 75 U/L (ref 38–126)
Anion gap: 11 (ref 5–15)
BUN: 32 mg/dL — ABNORMAL HIGH (ref 6–20)
CO2: 23 mmol/L (ref 22–32)
Calcium: 8.5 mg/dL — ABNORMAL LOW (ref 8.9–10.3)
Chloride: 104 mmol/L (ref 98–111)
Creatinine, Ser: 3.02 mg/dL — ABNORMAL HIGH (ref 0.44–1.00)
GFR, Estimated: 20 mL/min — ABNORMAL LOW (ref >60)
Glucose, Bld: 85 mg/dL (ref 70–99)
Potassium: 4.4 mmol/L (ref 3.5–5.1)
Sodium: 138 mmol/L (ref 135–145)
Total Bilirubin: 0.5 mg/dL (ref 0.0–1.2)
Total Protein: 5.2 g/dL — ABNORMAL LOW (ref 6.5–8.1)

## 2023-11-27 LAB — TYPE AND SCREEN
ABO/RH(D): B POS
Antibody Screen: NEGATIVE
Unit division: 0
Unit division: 0

## 2023-11-27 LAB — CBC
HCT: 29 % — ABNORMAL LOW (ref 36.0–46.0)
Hemoglobin: 9.3 g/dL — ABNORMAL LOW (ref 12.0–15.0)
MCH: 26.4 pg (ref 26.0–34.0)
MCHC: 32.1 g/dL (ref 30.0–36.0)
MCV: 82.4 fL (ref 80.0–100.0)
Platelets: 226 K/uL (ref 150–400)
RBC: 3.52 MIL/uL — ABNORMAL LOW (ref 3.87–5.11)
RDW: 18.8 % — ABNORMAL HIGH (ref 11.5–15.5)
WBC: 13.5 K/uL — ABNORMAL HIGH (ref 4.0–10.5)
nRBC: 0 % (ref 0.0–0.2)

## 2023-11-27 LAB — ANCA PROFILE
Anti-MPO Antibodies: <0.2 U (ref 0.0–0.9)
Anti-PR3 Antibodies: <0.2 U (ref 0.0–0.9)
Atypical P-ANCA titer: <1:20 {titer}
C-ANCA: <1:20 {titer}
P-ANCA: <1:20 {titer}

## 2023-11-27 LAB — SURGICAL PATHOLOGY

## 2023-11-27 MED ORDER — LISINOPRIL 10 MG PO TABS
10.0000 mg | ORAL_TABLET | Freq: Once | ORAL | Status: DC
Start: 2023-11-27 — End: 2023-11-27

## 2023-11-27 MED ORDER — APIXABAN 5 MG PO TABS
10.0000 mg | ORAL_TABLET | Freq: Two times a day (BID) | ORAL | Status: DC
  Administered 2023-11-28: 10 mg via ORAL
  Filled 2023-11-27 (×2): qty 2

## 2023-11-27 MED ORDER — APIXABAN 5 MG PO TABS: 5.0000 mg | ORAL_TABLET | Freq: Two times a day (BID) | ORAL | Status: DC

## 2023-11-27 MED ORDER — LABETALOL HCL 200 MG PO TABS
300.0000 mg | ORAL_TABLET | Freq: Three times a day (TID) | ORAL | Status: DC
  Administered 2023-11-27: 300 mg via ORAL
  Filled 2023-11-27: qty 1

## 2023-11-27 MED ORDER — LABETALOL HCL 200 MG PO TABS
300.0000 mg | ORAL_TABLET | Freq: Two times a day (BID) | ORAL | Status: DC
Start: 2023-11-27 — End: 2023-11-28
  Administered 2023-11-27 – 2023-11-28 (×2): 300 mg via ORAL
  Filled 2023-11-27 (×2): qty 1

## 2023-11-27 MED ORDER — TECHNETIUM TO 99M ALBUMIN AGGREGATED
4.4000 | Freq: Once | INTRAVENOUS | Status: AC
  Administered 2023-11-27: 4.4 via INTRAVENOUS

## 2023-11-27 MED ORDER — ALPRAZOLAM 0.5 MG PO TABS
0.5000 mg | ORAL_TABLET | Freq: Once | ORAL | Status: DC
  Filled 2023-11-27: qty 1

## 2023-11-27 MED ORDER — LISINOPRIL 10 MG PO TABS
10.0000 mg | ORAL_TABLET | Freq: Every day | ORAL | Status: DC
  Filled 2023-11-27: qty 1

## 2023-11-27 NOTE — Progress Notes (Signed)
 POSTPARTUM PROGRESS NOTE  POD #5  Subjective:  Mickie Badders is a 38 y.o. [id number] s/p emergent repeat C-section at [redacted]w[redacted]d. Today she notes she is doing well and ready to go home. She denies any problems with ambulating, voiding or po intake. Denies nausea or vomiting. She has passed flatus, not yet had a BM.  Pain is well controlled.  Lochia minimal Denies fever/chills/chest pain/SOB.  no HA, no blurry vision, no RUQ pain  Objective: Blood pressure (!) 155/89, pulse 88, temperature 98 F (36.7 C), temperature source Oral, resp. rate 18, height 5' 6 (1.676 m), weight (!) 143.4 kg, SpO2 95%, unknown if currently breastfeeding.  BP range: 123-159/68-89 UOP: 2500cc/8hr   Physical Exam:  General: alert, cooperative and no distress Chest: no respiratory distress, CTAB Heart: regular rate and rhythm Abdomen: obese, soft, nontender, +BS Uterine Fundus: firm, below umbilicus Incision: midline vertical with prevena in place- clean and dry DVT Evaluation: No calf tenderness bilaterally Extremities: 1+ edema Skin: warm, dry  Results for orders placed or performed during the hospital encounter of 11/22/23 (from the past 24 hours)  Type and screen [facility]     Status: None (Preliminary result)   Collection Time: 11/26/23  1:20 PM  Result Value Ref Range   ABO/RH(D) B POS    Antibody Screen NEG    Sample Expiration 11/29/2023,2359    Unit Number [id number]    Blood Component Type RBC LR PHER1    Unit division 00    Status of Unit ISSUED    Transfusion Status OK TO TRANSFUSE    Crossmatch Result      Compatible Performed at [facility], [address] [place], [place] [address]    Unit Number [id number]    Blood Component Type RBC LR PHER2    Unit division 00    Status of Unit ISSUED    Transfusion Status OK TO TRANSFUSE    Crossmatch Result Compatible   Prepare RBC (crossmatch)     Status: None   Collection Time: 11/26/23  2:21 PM  Result Value  Ref Range   Order Confirmation      ORDER PROCESSED BY BLOOD BANK Performed at [facility], [address] [place], [place] [address]   CBC     Status: Abnormal   Collection Time: 11/27/23  4:40 AM  Result Value Ref Range   WBC 13.5 (H) 4.0 - 10.5 K/uL   RBC 3.52 (L) 3.87 - 5.11 MIL/uL   Hemoglobin 9.3 (L) 12.0 - 15.0 g/dL   HCT 70.9 (L) 63.9 - 53.9 %   MCV 82.4 80.0 - 100.0 fL   MCH 26.4 26.0 - 34.0 pg   MCHC 32.1 30.0 - 36.0 g/dL   RDW 81.1 (H) 88.4 - 84.4 %   Platelets 226 150 - 400 K/uL   nRBC 0.0 0.0 - 0.2 %  Comprehensive metabolic panel     Status: Abnormal   Collection Time: 11/27/23  4:40 AM  Result Value Ref Range   Sodium 138 135 - 145 mmol/L   Potassium 4.4 3.5 - 5.1 mmol/L   Chloride 104 98 - 111 mmol/L   CO2 23 22 - 32 mmol/L   Glucose, Bld 85 70 - 99 mg/dL   BUN 32 (H) 6 - 20 mg/dL   Creatinine, Ser 6.97 (H) 0.44 - 1.00 mg/dL   Calcium 8.5 (L) 8.9 - 10.3 mg/dL   Total Protein 5.2 (L) 6.5 - 8.1 g/dL   Albumin  1.6 (L) 3.5 - 5.0 g/dL   AST 15 15 - 41 U/L   ALT 6 0 - 44 U/L   Alkaline Phosphatase 75 38 - 126 U/L   Total Bilirubin 0.5 0.0 - 1.2 mg/dL   GFR, Estimated 20 (L) >60 mL/min   Anion gap 11 5 - 15    Assessment/Plan: Maylani Embree is a 38 y.o. [id number] s/p repeat C-section- midline vertical skin at [redacted]w[redacted]d POD#5 complicated by: 1) Chronic hypertension with superimposed preeclampsia -S/p IV magnesium, discontinued due to oliguria with abnormal electrolytes - Currently on Procardia 60 mg twice daily, and labetalol 200mg  BID -plan to restart home Lisinopril- will start at 10mg   2) AKI - Cr now improving -Normal urine output - Nephrology following  3) FEN - Electrolytes stabilized, hyperkalemia resolved - Tolerating low-sodium diet  4) Heme-acute on chronic anemia -s/p 2upRBC, Hgb appropriate this am -pt asymptomatic  5) h/o DVT -Currently on therapeutic Lovenox, will plan to transition to Eliquis -plan to follow outpatient with  [facility] -due to Cr- unable to complete CTA to check for PE.  Will likely plan for ECHO  Contraception: considering vasectomy Feeding: baby in NICU- plans to bottle feed  Dispo: Pending BP management and imaging for cardiac assessment- possible discharge later today or tomorrow   LOS: 5 days   Zyionna Pesce, DO Faculty Attending, Center for [facility] 11/27/2023, 10:28 AM

## 2023-11-27 NOTE — Telephone Encounter (Signed)
 Pharmacy Patient Advocate Encounter  Insurance verification completed.    The patient is insured through [facility] MEDICAID.     Ran test claim for Lovenox 100mg  and the current 30 day co-pay is $4.00  Ran test claim for Xarelto 20mg  and the current 30 day co-pay is $4.00  Ran test claim for Eliquis 5mg  and the current 30 day co-pay is $4.00   This test claim was processed through [employer]- copay amounts may vary at other pharmacies due to [employer], or as the patient moves through the different stages of their insurance plan.

## 2023-11-27 NOTE — Progress Notes (Signed)
 [facility] Progress Note    Assessment/ Plan:   AKI -baseline unknown. Likely related to preeclampsia induced ATN given placental abruption, ABLA, variable Bps. Significant proteinuria with hypoalbumin -improving, nonoliguric. Peak Cr 4.39, down to 3 today fortunately. With post-ATN diuresis now, encouraged PO hydration -serologies pending which we can follow as an outpatient. She may end up needing a renal biopsy if her renal function stalls in regards to improvement -Avoid nephrotoxic medications including NSAIDs and iodinated intravenous contrast exposure unless the latter is absolutely indicated.  Preferred narcotic agents for pain control are hydromorphone, fentanyl, and methadone. Morphine should not be used. Avoid Baclofen and avoid oral sodium phosphate and magnesium citrate based laxatives / bowel preps. Continue strict Input and Output monitoring. Will monitor the patient closely with you and intervene or adjust therapy as indicated by changes in clinical status/labs   HTN Preeclampsia -variable BP -will discontinue lisinopril, not the right time to start this, can start hydralazine if needed  Nephrotic syndrome -likely secondary to preeclampsia -ANA negative, rest of serologies pending -UPC unfortunately not calculated due to total protein being too high (10/15). This could have been calculated. 24 hr urine protein results pending  ABLA -transfuse prn for hgb <7 -per primary service  Renal function continues to improve. Will sign off for now. Will arrange for outpatient follow up at our office and will follow up results as an outpatient.  Please call with any questions/concerns in the interim.  Glenda Stank, MD [facility]   Subjective:   Patient seen and examined bedside. No complaints, feels well. She reports that her swelling improved after starting lovenox. Uop 5.7L.   Objective:   BP 124/83 (BP Location: Right Arm)   Pulse 84    Temp 98 F (36.7 C) (Oral)   Resp 18   Ht 5' 6 (1.676 m)   Wt (!) 143.4 kg   SpO2 98%   Breastfeeding Unknown   BMI 51.04 kg/m   Intake/Output Summary (Last 24 hours) at 11/27/2023 1343 Last data filed at 11/27/2023 0501 Gross per 24 hour  Intake 1379 ml  Output 4200 ml  Net -2821 ml   Weight change:   Physical Exam: Gen: NAD CVS: RRR Resp: normal wob, unlabored, speaking in full sentences Abd: soft Ext: +edema Neuro: awake, alert  Imaging: VAS US  LOWER EXTREMITY VENOUS (DVT) Result Date: 11/26/2023  Lower Venous DVT Study Patient Name:  Glenda Romero  Date of Exam:   11/26/2023 Medical Rec #: [id number]     Accession #:    [id number] Date of Birth: 09-13-1985      Patient Gender: F Patient Age:   38 years Exam Location:  [facility] Procedure:      VAS US  LOWER EXTREMITY VENOUS (DVT) Referring Phys: KIETH CAROLIN --------------------------------------------------------------------------------  Indications: Swelling, and History of left leg DVT 6 months ago. No report on file. Other Indications: Postpartum. Risk Factors: Obesity. Anticoagulation: Lovenox. Limitations: Body habitus. Comparison Study: No priors Performing Technologist: Rita Sturdivant-Jones RDMS, RVT  Examination Guidelines: A complete evaluation includes B-mode imaging, spectral Doppler, color Doppler, and power Doppler as needed of all accessible portions of each vessel. Bilateral testing is considered an integral part of a complete examination. Limited examinations for reoccurring indications may be performed as noted. The reflux portion of the exam is performed with the patient in reverse Trendelenburg.  +---------+---------------+---------+-----------+----------+--------------+ RIGHT    CompressibilityPhasicitySpontaneityPropertiesThrombus Aging +---------+---------------+---------+-----------+----------+--------------+ CFV      Full  Yes      Yes                                  +---------+---------------+---------+-----------+----------+--------------+ SFJ      Full                                                        +---------+---------------+---------+-----------+----------+--------------+ FV Prox  Full                                                        +---------+---------------+---------+-----------+----------+--------------+ FV Mid   Full                                                        +---------+---------------+---------+-----------+----------+--------------+ FV DistalFull           Yes      Yes                                 +---------+---------------+---------+-----------+----------+--------------+ PFV      Full                                                        +---------+---------------+---------+-----------+----------+--------------+ POP      Full           Yes      Yes                                 +---------+---------------+---------+-----------+----------+--------------+ PTV      Full                                         appears patent +---------+---------------+---------+-----------+----------+--------------+ PERO     Full                                         appears patent +---------+---------------+---------+-----------+----------+--------------+   +---------+---------------+---------+-----------+----------+--------------+ LEFT     CompressibilityPhasicitySpontaneityPropertiesThrombus Aging +---------+---------------+---------+-----------+----------+--------------+ CFV      Full           Yes      Yes                                 +---------+---------------+---------+-----------+----------+--------------+ SFJ      Full                                                        +---------+---------------+---------+-----------+----------+--------------+  FV Prox  Full                                                         +---------+---------------+---------+-----------+----------+--------------+ FV Mid   Full           Yes      Yes                                 +---------+---------------+---------+-----------+----------+--------------+ FV DistalFull           Yes      Yes                                 +---------+---------------+---------+-----------+----------+--------------+ PFV      Full                                                        +---------+---------------+---------+-----------+----------+--------------+ POP      Partial        No       No                   Chronic        +---------+---------------+---------+-----------+----------+--------------+ PTV      Full                                                        +---------+---------------+---------+-----------+----------+--------------+ PERO     Full                                                        +---------+---------------+---------+-----------+----------+--------------+    Summary: RIGHT: - There is no evidence of deep vein thrombosis in the lower extremity.  - No cystic structure found in the popliteal fossa.  LEFT: - Findings consistent with chronic deep vein thrombosis involving the left popliteal vein.  - No cystic structure found in the popliteal fossa.  *See table(s) above for measurements and observations. Electronically signed by Lonni Gaskins MD on 11/26/2023 at 3:46:30 PM.    Final     Labs: BMET Recent Labs  Lab 11/23/23 0334 11/23/23 0940 11/23/23 1450 11/24/23 0454 11/25/23 0424 11/26/23 0430 11/27/23 0440  NA 133* 132* 135 133* 134* 135 138  K 5.2* 4.7 4.9 5.1 4.9 4.4 4.4  CL 103 101 103 103 102 104 104  CO2 19* 19* 21* 19* 20* 21* 23  GLUCOSE 125* 111* 104* 102* 87 92 85  BUN 22* 24* 27* 33* 37* 37* 32*  CREATININE 2.95* 3.27* 3.62* 4.31* 4.39* 3.97* 3.02*  CALCIUM 7.8* 7.8* 7.8* 8.3* 8.3* 8.1* 8.5*   CBC Recent Labs  Lab 11/22/23 0935 11/22/23 1035 11/24/23 0454  11/25/23 0424 11/26/23 0430 11/27/23 0440  WBC 18.9*   < >  21.0* 15.3* 14.1* 13.5*  NEUTROABS 15.0*  --   --   --   --   --   HGB 8.1*   < > 8.9* 8.3* 7.5* 9.3*  HCT 27.5*   < > 27.6* 26.1* 24.0* 29.0*  MCV 78.1*   < > 78.9* 80.6 82.8 82.4  PLT 294   < > 133* 150 183 226   < > = values in this interval not displayed.    Medications:     acetaminophen   1,000 mg Oral Q8H   [START ON 11/28/2023] apixaban  10 mg Oral BID   Followed by   NOREEN ON 12/05/2023] apixaban  5 mg Oral BID   gabapentin  100 mg Oral BID   labetalol  300 mg Oral BID   lisinopril  10 mg Oral Daily   NIFEdipine  60 mg Oral BID   polyethylene glycol  17 g Oral QODAY   prenatal multivitamin  1 tablet Oral Q1200   simethicone  80 mg Oral TID PC      Laquesha Holcomb, MD [facility] 11/27/2023, 1:43 PM

## 2023-11-28 ENCOUNTER — Other Ambulatory Visit (HOSPITAL_COMMUNITY): Payer: Self-pay

## 2023-11-28 LAB — COMPREHENSIVE METABOLIC PANEL WITH GFR
ALT: 9 U/L (ref 0–44)
AST: 21 U/L (ref 15–41)
Albumin: 1.7 g/dL — ABNORMAL LOW (ref 3.5–5.0)
Alkaline Phosphatase: 73 U/L (ref 38–126)
Anion gap: 8 (ref 5–15)
BUN: 25 mg/dL — ABNORMAL HIGH (ref 6–20)
CO2: 25 mmol/L (ref 22–32)
Calcium: 8.4 mg/dL — ABNORMAL LOW (ref 8.9–10.3)
Chloride: 104 mmol/L (ref 98–111)
Creatinine, Ser: 2.25 mg/dL — ABNORMAL HIGH (ref 0.44–1.00)
GFR, Estimated: 28 mL/min — ABNORMAL LOW (ref >60)
Glucose, Bld: 80 mg/dL (ref 70–99)
Potassium: 4.7 mmol/L (ref 3.5–5.1)
Sodium: 137 mmol/L (ref 135–145)
Total Bilirubin: 0.6 mg/dL (ref 0.0–1.2)
Total Protein: 5.5 g/dL — ABNORMAL LOW (ref 6.5–8.1)

## 2023-11-28 LAB — CBC
HCT: 29.2 % — ABNORMAL LOW (ref 36.0–46.0)
Hemoglobin: 9.5 g/dL — ABNORMAL LOW (ref 12.0–15.0)
MCH: 26.9 pg (ref 26.0–34.0)
MCHC: 32.5 g/dL (ref 30.0–36.0)
MCV: 82.7 fL (ref 80.0–100.0)
Platelets: 247 K/uL (ref 150–400)
RBC: 3.53 MIL/uL — ABNORMAL LOW (ref 3.87–5.11)
RDW: 18.5 % — ABNORMAL HIGH (ref 11.5–15.5)
WBC: 12.8 K/uL — ABNORMAL HIGH (ref 4.0–10.5)
nRBC: 0 % (ref 0.0–0.2)

## 2023-11-28 LAB — COMPLEMENT, TOTAL: Compl, Total (CH50): >60 U/mL (ref >41)

## 2023-11-28 LAB — C3 COMPLEMENT: C3 Complement: 140 mg/dL (ref 82–167)

## 2023-11-28 LAB — C4 COMPLEMENT: Complement C4, Body Fluid: 34 mg/dL (ref 12–38)

## 2023-11-28 LAB — ANTISTREPTOLYSIN O TITER: ASO: 66.4 [IU]/mL (ref 0.0–200.0)

## 2023-11-28 MED ORDER — HYDRALAZINE HCL 10 MG PO TABS: 10.0000 mg | ORAL_TABLET | Freq: Four times a day (QID) | ORAL | Status: DC

## 2023-11-28 MED ORDER — ACETAMINOPHEN 325 MG PO TABS
650.0000 mg | ORAL_TABLET | Freq: Four times a day (QID) | ORAL | 0 refills | Status: AC | PRN
  Filled 2023-11-28: qty 30, 4d supply, fill #0

## 2023-11-28 MED ORDER — DOCUSATE SODIUM 100 MG PO CAPS
100.0000 mg | ORAL_CAPSULE | Freq: Two times a day (BID) | ORAL | 0 refills | Status: DC | PRN
Start: 2023-11-28 — End: 2023-12-12
  Filled 2023-11-28: qty 60, 30d supply, fill #0

## 2023-11-28 MED ORDER — HYDRALAZINE HCL 10 MG PO TABS: 10.0000 mg | ORAL_TABLET | Freq: Every day | ORAL | 1 refills | Status: DC

## 2023-11-28 MED ORDER — HYDRALAZINE HCL 10 MG PO TABS
10.0000 mg | ORAL_TABLET | Freq: Every day | ORAL | 1 refills | Status: DC
  Filled 2023-11-28: qty 30, 30d supply, fill #0

## 2023-11-28 MED ORDER — OXYCODONE HCL 5 MG PO TABS
5.0000 mg | ORAL_TABLET | Freq: Four times a day (QID) | ORAL | 0 refills | Status: DC | PRN
  Filled 2023-11-28: qty 30, 5d supply, fill #0

## 2023-11-28 MED ORDER — HYDRALAZINE HCL 10 MG PO TABS
10.0000 mg | ORAL_TABLET | Freq: Every day | ORAL | Status: DC
  Administered 2023-11-28: 10 mg via ORAL
  Filled 2023-11-28: qty 1

## 2023-11-28 MED ORDER — LISINOPRIL 10 MG PO TABS: 10.0000 mg | ORAL_TABLET | Freq: Every day | ORAL | Status: DC

## 2023-11-28 MED ORDER — APIXABAN (ELIQUIS) VTE STARTER PACK (10MG AND 5MG)
ORAL_TABLET | ORAL | 30 refills | Status: AC
  Filled 2023-11-28: qty 74, 30d supply, fill #0

## 2023-11-28 MED ORDER — LABETALOL HCL 300 MG PO TABS: 300.0000 mg | ORAL_TABLET | Freq: Two times a day (BID) | ORAL | 1 refills | Status: DC

## 2023-11-28 MED ORDER — HYDRALAZINE HCL 10 MG PO TABS
10.0000 mg | ORAL_TABLET | Freq: Four times a day (QID) | ORAL | 0 refills | Status: AC
  Filled 2023-11-28: qty 110, 28d supply, fill #0

## 2023-11-28 MED ORDER — NIFEDIPINE ER 60 MG PO TB24
60.0000 mg | ORAL_TABLET | Freq: Two times a day (BID) | ORAL | 1 refills | Status: AC
  Filled 2023-11-28: qty 60, 30d supply, fill #0

## 2023-11-28 MED ORDER — NIFEDIPINE ER 60 MG PO TB24: 60.0000 mg | ORAL_TABLET | Freq: Two times a day (BID) | ORAL | 1 refills | Status: DC

## 2023-11-28 MED ORDER — OXYCODONE HCL 5 MG PO TABS: 5.0000 mg | ORAL_TABLET | Freq: Four times a day (QID) | ORAL | 0 refills | Status: DC | PRN

## 2023-11-28 MED ORDER — LABETALOL HCL 300 MG PO TABS
300.0000 mg | ORAL_TABLET | Freq: Two times a day (BID) | ORAL | 1 refills | Status: AC
  Filled 2023-11-28: qty 60, 30d supply, fill #0

## 2023-11-28 NOTE — Plan of Care (Signed)
  Problem: Education: Goal: Knowledge of disease or condition will improve Outcome: Adequate for Discharge Goal: Knowledge of the prescribed therapeutic regimen will improve Outcome: Adequate for Discharge   Problem: Fluid Volume: Goal: Peripheral tissue perfusion will improve Outcome: Adequate for Discharge   Problem: Clinical Measurements: Goal: Complications related to disease process, condition or treatment will be avoided or minimized Outcome: Adequate for Discharge   Problem: Education: Goal: Knowledge of General Education information will improve Description: Including pain rating scale, medication(s)/side effects and non-pharmacologic comfort measures Outcome: Adequate for Discharge   Problem: Health Behavior/Discharge Planning: Goal: Ability to manage health-related needs will improve Outcome: Adequate for Discharge   Problem: Clinical Measurements: Goal: Ability to maintain clinical measurements within normal limits will improve Outcome: Adequate for Discharge Goal: Will remain free from infection Outcome: Adequate for Discharge Goal: Diagnostic test results will improve Outcome: Adequate for Discharge Goal: Respiratory complications will improve Outcome: Adequate for Discharge Goal: Cardiovascular complication will be avoided Outcome: Adequate for Discharge   Problem: Activity: Goal: Risk for activity intolerance will decrease Outcome: Adequate for Discharge   Problem: Nutrition: Goal: Adequate nutrition will be maintained Outcome: Adequate for Discharge   Problem: Coping: Goal: Level of anxiety will decrease Outcome: Adequate for Discharge   Problem: Elimination: Goal: Will not experience complications related to bowel motility Outcome: Adequate for Discharge Goal: Will not experience complications related to urinary retention Outcome: Adequate for Discharge   Problem: Pain Managment: Goal: General experience of comfort will improve and/or be  controlled Outcome: Adequate for Discharge   Problem: Safety: Goal: Ability to remain free from injury will improve Outcome: Adequate for Discharge   Problem: Skin Integrity: Goal: Risk for impaired skin integrity will decrease Outcome: Adequate for Discharge   Problem: Education: Goal: Knowledge of the prescribed therapeutic regimen will improve Outcome: Adequate for Discharge Goal: Understanding of sexual limitations or changes related to disease process or condition will improve Outcome: Adequate for Discharge Goal: Individualized Educational Video(s) Outcome: Adequate for Discharge   Problem: Self-Concept: Goal: Communication of feelings regarding changes in body function or appearance will improve Outcome: Adequate for Discharge   Problem: Skin Integrity: Goal: Demonstration of wound healing without infection will improve Outcome: Adequate for Discharge   Problem: Education: Goal: Knowledge of condition will improve Outcome: Adequate for Discharge Goal: Individualized Educational Video(s) Outcome: Adequate for Discharge Goal: Individualized Newborn Educational Video(s) Outcome: Adequate for Discharge   Problem: Activity: Goal: Will verbalize the importance of balancing activity with adequate rest periods Outcome: Adequate for Discharge Goal: Ability to tolerate increased activity will improve Outcome: Adequate for Discharge   Problem: Coping: Goal: Ability to identify and utilize available resources and services will improve Outcome: Adequate for Discharge   Problem: Life Cycle: Goal: Chance of risk for complications during the postpartum period will decrease Outcome: Adequate for Discharge   Problem: Role Relationship: Goal: Ability to demonstrate positive interaction with newborn will improve Outcome: Adequate for Discharge   Problem: Skin Integrity: Goal: Demonstration of wound healing without infection will improve Outcome: Adequate for Discharge

## 2023-11-28 NOTE — Discharge Summary (Signed)
 Postpartum Discharge Summary  Date of Service updated 11/28/2023     Patient Name: Glenda Romero DOB: [birth year] MRN: [id number]  Date of admission: 11/22/2023 Delivery date:   Glenda Romero [[id number]]  11/22/2023    Glenda Romero [[id number]]  11/22/2023 Delivering provider:    Darlena Clayborne Nathaneil Romero [[id number]]  Glenda Romero Glenda Romero [[id number]]  Glenda Romero Date of discharge: 11/28/2023  Admitting diagnosis: Placenta abruptio, antepartum, third trimester [O45.93] Pregnancy [Z34.90] Intrauterine pregnancy: [redacted]w[redacted]d     Secondary diagnosis:  Principal Problem:   Placenta abruptio, antepartum, third trimester Active Problems:   Chronic hypertension affecting pregnancy   BMI 50.0-59.9, adult (HCC)   Twin gestation in third trimester   No prenatal care in current pregnancy in second trimester   AKI (acute kidney injury)   History of cesarean delivery affecting pregnancy   Pregnancy   Snoring   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 [id number] 27 weeks with insufficient prenatal care, seen in [place] early with twin diagnosis. Three prior cesarean section.  She presented with abdominal pain and abruption was suspected. Emergent cesarean section was performed, Subsequent demise of one twin second in NICU. She was transfused due to high blood loss. Followed for BP control and for AKI Magnesium Sulfate received: Yes: Seizure prophylaxis BMZ received: No Rhophylac:No MMR:No T-DaP:no Flu: No RSV Vaccine received: No Transfusion:Yes  Immunizations received:  There is no  immunization history on file for this patient.  Physical exam  Vitals:   11/27/23 1802 11/27/23 2225 11/28/23 0213 11/28/23 0612  BP: (!) 146/84 (!) 156/79 (!) 153/93 (!) 159/95  Pulse: 86 89 87 87  Resp: 18 18 18 19   Temp: 98.3 F (36.8 C) 98.8 F (37.1 C) 98.3 F (36.8 C) 98.4 F (36.9 C)  TempSrc: Oral Oral Oral Oral  SpO2: 99% 99%  98%  Weight:      Height:       General: alert, cooperative, and no distress Lochia: appropriate Uterine Fundus: firm Incision: Dressing is clean, dry, and intact, Prevena in place DVT Evaluation: No evidence of DVT seen on physical exam. Labs: Lab Results  Component Value Date   WBC 12.8 (H) 11/28/2023   HGB 9.5 (L) 11/28/2023   HCT 29.2 (L) 11/28/2023   MCV 82.7 11/28/2023   PLT 247 11/28/2023      Latest Ref Rng & Units 11/28/2023    4:30 AM  CMP  Glucose 70 - 99 mg/dL 80   BUN 6 - 20 mg/dL 25   Creatinine 9.55 - 1.00 mg/dL 7.74   Sodium 864 - 854 mmol/L 137   Potassium 3.5 - 5.1 mmol/L 4.7   Chloride 98 - 111 mmol/L 104   CO2 22 - 32 mmol/L 25   Calcium 8.9 - 10.3 mg/dL 8.4   Total Protein 6.5 - 8.1 g/dL 5.5   Total Bilirubin 0.0 - 1.2 mg/dL 0.6   Alkaline Phos 38 - 126 U/L 73   AST  15 - 41 U/L 21   ALT 0 - 44 U/L 9    Edinburgh Score:    11/23/2023    6:26 PM  Edinburgh Postnatal Depression Scale Screening Tool  I have been able to laugh and see the funny side of things. 0   I have looked forward with enjoyment to things. 0   I have blamed myself unnecessarily when things went wrong. 1   I have been anxious or worried for no good reason. 1   I have felt scared or panicky for no good reason. 0   Things have been getting on top of me. 0   I have been so unhappy that I have had difficulty sleeping. 0   I have felt sad or miserable. 2   I have been so unhappy that I have been crying. 1   The thought of harming myself has occurred to me. 0   Edinburgh Postnatal Depression Scale Total 5     Data saved with a previous  flowsheet row definition   No data recorded  After visit meds:  Allergies as of 11/28/2023   No Known Allergies      Medication List     STOP taking these medications    azithromycin  250 MG tablet Commonly known as: ZITHROMAX    benzonatate  100 MG capsule Commonly known as: TESSALON    ibuprofen  600 MG tablet Commonly known as: ADVIL    ondansetron  8 MG disintegrating tablet Commonly known as: ZOFRAN -ODT   pseudoephedrine  120 MG 12 hr tablet Commonly known as: SUDAFED       TAKE these medications    hydrALAZINE 10 MG tablet Commonly known as: APRESOLINE Take 1 tablet (10 mg total) by mouth daily.   labetalol 300 MG tablet Commonly known as: NORMODYNE Take 1 tablet (300 mg total) by mouth 2 (two) times daily.   NIFEdipine 60 MG 24 hr tablet Commonly known as: ADALAT CC Take 1 tablet (60 mg total) by mouth 2 (two) times daily.   oxyCODONE  5 MG immediate release tablet Commonly known as: Oxy IR/ROXICODONE  Take 1 tablet (5 mg total) by mouth every 6 (six) hours as needed for severe pain (pain score 7-10).         Discharge home in stable condition Infant Feeding: Bottle and Breast Infant Disposition:NICU Discharge instruction: per After Visit Summary and Postpartum booklet. Activity: Advance as tolerated. Pelvic rest for 6 weeks.  Diet: routine diet Future Appointments: Future Appointments  Date Time Provider Department Center  11/30/2023  3:00 PM [facility] NURSE [facility]   Follow up Visit:  Follow-up Information     Center for [facility] Follow up in 2 day(s).   Specialty: Obstetrics and Gynecology Why: For wound re-check, Prevena Contact information: [address] [place]  [address] [phone]                 Please schedule this patient for a In person postpartum visit in 2-3 days with the following provider: Any provider. Additional Postpartum F/U:Incision check 2-3  days  High risk pregnancy complicated by: HTN and no prenatal care, twins, abruption Delivery mode:     Glenda Romero [[id number]]  C-Section, Classical    Lailani, Tool Parnell [[id number]]  C-Section, Classical Mid-transverse cesarean with vertical skin incision Anticipated Birth Control:  Unsure   11/28/2023 Lynwood Solomons, MD

## 2023-11-28 NOTE — Progress Notes (Signed)
   11/28/23 1352  Departure Condition  Departure Condition Good  Mobility at [employer]  Patient/Caregiver Teaching Teach Back Method Used;Discharge instructions reviewed;Prescriptions reviewed;Follow-up care reviewed;Pain management discussed;Medications discussed;Patient/caregiver verbalized understanding;Educated about hypertension in pregnancy  Departure Mode With significant other   Patient alert and oriented x4, VS and pain stable.

## 2023-11-28 NOTE — Plan of Care (Signed)
  Problem: Education: Goal: Knowledge of disease or condition will improve Outcome: Progressing Goal: Knowledge of the prescribed therapeutic regimen will improve Outcome: Progressing   Problem: Fluid Volume: Goal: Peripheral tissue perfusion will improve Outcome: Progressing   Problem: Clinical Measurements: Goal: Complications related to disease process, condition or treatment will be avoided or minimized Outcome: Progressing   Problem: Education: Goal: Knowledge of General Education information will improve Description: Including pain rating scale, medication(s)/side effects and non-pharmacologic comfort measures Outcome: Progressing   Problem: Health Behavior/Discharge Planning: Goal: Ability to manage health-related needs will improve Outcome: Progressing   Problem: Clinical Measurements: Goal: Ability to maintain clinical measurements within normal limits will improve Outcome: Progressing Goal: Will remain free from infection Outcome: Progressing Goal: Diagnostic test results will improve Outcome: Progressing Goal: Respiratory complications will improve Outcome: Progressing Goal: Cardiovascular complication will be avoided Outcome: Progressing   Problem: Activity: Goal: Risk for activity intolerance will decrease Outcome: Progressing   Problem: Nutrition: Goal: Adequate nutrition will be maintained Outcome: Progressing   Problem: Coping: Goal: Level of anxiety will decrease Outcome: Progressing   Problem: Elimination: Goal: Will not experience complications related to bowel motility Outcome: Progressing Goal: Will not experience complications related to urinary retention Outcome: Progressing   Problem: Pain Managment: Goal: General experience of comfort will improve and/or be controlled Outcome: Progressing   Problem: Safety: Goal: Ability to remain free from injury will improve Outcome: Progressing   Problem: Skin Integrity: Goal: Risk for impaired  skin integrity will decrease Outcome: Progressing   Problem: Education: Goal: Knowledge of the prescribed therapeutic regimen will improve Outcome: Progressing Goal: Understanding of sexual limitations or changes related to disease process or condition will improve Outcome: Progressing Goal: Individualized Educational Video(s) Outcome: Progressing   Problem: Self-Concept: Goal: Communication of feelings regarding changes in body function or appearance will improve Outcome: Progressing   Problem: Skin Integrity: Goal: Demonstration of wound healing without infection will improve Outcome: Progressing   Problem: Education: Goal: Knowledge of condition will improve Outcome: Progressing Goal: Individualized Educational Video(s) Outcome: Progressing Goal: Individualized Newborn Educational Video(s) Outcome: Progressing   Problem: Activity: Goal: Will verbalize the importance of balancing activity with adequate rest periods Outcome: Progressing Goal: Ability to tolerate increased activity will improve Outcome: Progressing   Problem: Coping: Goal: Ability to identify and utilize available resources and services will improve Outcome: Progressing   Problem: Life Cycle: Goal: Chance of risk for complications during the postpartum period will decrease Outcome: Progressing   Problem: Role Relationship: Goal: Ability to demonstrate positive interaction with newborn will improve Outcome: Progressing   Problem: Skin Integrity: Goal: Demonstration of wound healing without infection will improve Outcome: Progressing

## 2023-11-30 ENCOUNTER — Ambulatory Visit (INDEPENDENT_AMBULATORY_CARE_PROVIDER_SITE_OTHER): Admitting: *Deleted

## 2023-11-30 VITALS — BP 196/118 | HR 83 | Ht 66.0 in | Wt 281.2 lb

## 2023-11-30 DIAGNOSIS — Z4889 Encounter for other specified surgical aftercare: Secondary | ICD-10-CM

## 2023-11-30 NOTE — Progress Notes (Signed)
 Here for wound check and removal of prevena.  Had a repeat stat c/s for suspected placental abruption 11/22/23 with CHTN with preeclampsia , DVT, acute kidney injury.   BP 192/123.  Reports she hasn't taken her bp meds because hasn't eaten today and has just came from funeral home for her twin that died. Prevena and dressing removed. Vertical Incision remains intact with staples with pink edge noted about 1/3 from top of incision. Dr. Lola in to view wound and review initial BP . Recommends leave staples in until early next week, then remove. Appointment made for Monday.  Reviewed wound care and warning signs. Repeat BP 196/118 reported to Dr. Lola. He recommends patient go to [facility]U for evaluation. I informed patient and her partner we advise go to hospital now for evaluation of BP. I explained risk of untreated HTN. She voices understanding. Report called to Charge RN at MAU who will notify [facility]U provider of patient expected arrival.  Rock Skip PEAK

## 2023-12-03 ENCOUNTER — Encounter (HOSPITAL_COMMUNITY): Payer: Self-pay | Admitting: Obstetrics and Gynecology

## 2023-12-03 ENCOUNTER — Inpatient Hospital Stay (HOSPITAL_COMMUNITY)

## 2023-12-03 ENCOUNTER — Inpatient Hospital Stay (HOSPITAL_COMMUNITY)
Admission: AD | Admit: 2023-12-03 | Discharge: 2023-12-03 | Disposition: A | Discharge status: Home / Self Care | Attending: Family Medicine | Admitting: Family Medicine

## 2023-12-03 DIAGNOSIS — O1003 Pre-existing essential hypertension complicating the puerperium: Secondary | ICD-10-CM | POA: Diagnosis not present

## 2023-12-03 DIAGNOSIS — R030 Elevated blood-pressure reading, without diagnosis of hypertension: Secondary | ICD-10-CM | POA: Diagnosis present

## 2023-12-03 DIAGNOSIS — Z5329 Procedure and treatment not carried out because of patient's decision for other reasons: Secondary | ICD-10-CM | POA: Diagnosis not present

## 2023-12-03 DIAGNOSIS — G8918 Other acute postprocedural pain: Secondary | ICD-10-CM | POA: Diagnosis present

## 2023-12-03 DIAGNOSIS — O900 Disruption of cesarean delivery wound: Secondary | ICD-10-CM | POA: Insufficient documentation

## 2023-12-03 DIAGNOSIS — R109 Unspecified abdominal pain: Secondary | ICD-10-CM | POA: Diagnosis not present

## 2023-12-03 LAB — CBC WITH DIFFERENTIAL/PLATELET
Abs Immature Granulocytes: 0.03 K/uL (ref 0.00–0.07)
Basophils Absolute: 0 K/uL (ref 0.0–0.1)
Basophils Relative: 0 %
Eosinophils Absolute: 0.1 K/uL (ref 0.0–0.5)
Eosinophils Relative: 1 %
HCT: 31 % — ABNORMAL LOW (ref 36.0–46.0)
Hemoglobin: 9.7 g/dL — ABNORMAL LOW (ref 12.0–15.0)
Immature Granulocytes: 0 %
Lymphocytes Relative: 20 %
Lymphs Abs: 2.6 K/uL (ref 0.7–4.0)
MCH: 26.9 pg (ref 26.0–34.0)
MCHC: 31.3 g/dL (ref 30.0–36.0)
MCV: 86.1 fL (ref 80.0–100.0)
Monocytes Absolute: 0.5 K/uL (ref 0.1–1.0)
Monocytes Relative: 4 %
Neutro Abs: 9.7 K/uL — ABNORMAL HIGH (ref 1.7–7.7)
Neutrophils Relative %: 75 %
Platelets: 377 K/uL (ref 150–400)
RBC: 3.6 MIL/uL — ABNORMAL LOW (ref 3.87–5.11)
RDW: 18.1 % — ABNORMAL HIGH (ref 11.5–15.5)
WBC: 12.9 K/uL — ABNORMAL HIGH (ref 4.0–10.5)
nRBC: 0 % (ref 0.0–0.2)

## 2023-12-03 LAB — COMPREHENSIVE METABOLIC PANEL WITH GFR
ALT: 15 U/L (ref 0–44)
AST: 15 U/L (ref 15–41)
Albumin: 2.4 g/dL — ABNORMAL LOW (ref 3.5–5.0)
Alkaline Phosphatase: 71 U/L (ref 38–126)
Anion gap: 9 (ref 5–15)
BUN: 10 mg/dL (ref 6–20)
CO2: 24 mmol/L (ref 22–32)
Calcium: 8.8 mg/dL — ABNORMAL LOW (ref 8.9–10.3)
Chloride: 103 mmol/L (ref 98–111)
Creatinine, Ser: 1.31 mg/dL — ABNORMAL HIGH (ref 0.44–1.00)
GFR, Estimated: 53 mL/min — ABNORMAL LOW (ref >60)
Glucose, Bld: 86 mg/dL (ref 70–99)
Potassium: 4.7 mmol/L (ref 3.5–5.1)
Sodium: 136 mmol/L (ref 135–145)
Total Bilirubin: 0.5 mg/dL (ref 0.0–1.2)
Total Protein: 6.5 g/dL (ref 6.5–8.1)

## 2023-12-03 MED ORDER — FENTANYL CITRATE (PF) 100 MCG/2ML IJ SOLN
INTRAMUSCULAR | Status: AC
  Administered 2023-12-03: 100 ug via INTRAVENOUS
  Filled 2023-12-03: qty 2

## 2023-12-03 MED ORDER — FENTANYL CITRATE (PF) 100 MCG/2ML IJ SOLN: 100.0000 ug | Freq: Once | INTRAMUSCULAR | Status: AC

## 2023-12-03 MED ORDER — IOHEXOL 350 MG/ML SOLN
100.0000 mL | Freq: Once | INTRAVENOUS | Status: AC | PRN
Start: 2023-12-03 — End: 2023-12-03
  Administered 2023-12-03: 100 mL via INTRAVENOUS

## 2023-12-03 NOTE — MAU Note (Signed)
 Glenda Romero is a 38 y.o. at Unknown here in MAU reporting: PP c/s on 11/22/23 that was a vertical incision. Emergency c/s for an abruption of twins. Had a wound vac removed on Thursday 11/30/23. States she was going to the store today and felt liquid hitting her foot and saw that her incision is leaking a foul smell liquid. Increase in incision pain today and entire abdomen. Denies any N/V/D. Denies any fevers. Has not taken any pain medication today. Took all 3 BP meds at 0800 today. Denies any HA or changes in vision today.  Next OB appointment tomorrow to have staples removed.   Onset of complaint: today Pain score: 7 Vitals:   12/03/23 1416  BP: (!) 180/106  Pulse: 94  Resp: 20  Temp: 98.4 F (36.9 C)  SpO2: 96%     Lab orders placed from triage:  UA

## 2023-12-03 NOTE — MAU Provider Note (Addendum)
 History     CSN: [id number]  Arrival date and time: 12/03/23 1332   Event Date/Time   First Provider Initiated Contact with Patient 12/03/23 1505      Chief Complaint  Patient presents with   Post-op Problem   HPI [name] is a 38 y.o. year old [id number] female at 39 days PP s/p stat RCS of twins d/t placental abruption who presents to MAU reporting a gush of foul-smelling liquid dripping from her incision in the seat of her car and dripping down to her feet. She reports increased pain in incision and abdomen; rated 7/10. She denies taking any pain medication today. She states the Prevena wasn't working right from about an hour after she got home from the hospital. She denies N/V/D, fever, H/A or visual changes. Her C/S was on 11/22/2023 @ 27 weeks. Twin A was a demise and Twin B is in the NICU. She was d/c'd home on 11/28/2023. The Prevena was removed on 11/30/2023. She has cHTN. She takes Apresoline 10 mg daily, Labetalol 300 mg BID and Procardia 60 mg BID. She reports she took her HTN meds in the car on the way to hospital. She has a follow-up with [facility]; next appt is 12/04/2023. Her partner is present and contributing to the history taking.   OB History     Gravida  5   Para  5   Term  3   Preterm  2   AB  0   Living  6      SAB  0   IAB  0   Ectopic  0   Multiple  2   Live Births  7           Past Medical History:  Diagnosis Date   Gestational hypertension    Obesity     Past Surgical History:  Procedure Laterality Date   CESAREAN SECTION MULTI-GESTATIONAL N/A 11/22/2023   Procedure: CESAREAN SECTION, MULTIPLE FETUSES;  Surgeon: Glenda Harari, MD;  Location: [facility]S;  Service: Obstetrics;  Laterality: N/A;   TONSILLECTOMY      History reviewed. No pertinent family history.  Social History   Tobacco Use   Smoking status: Never  Vaping Use   Vaping status: Never Used  Substance Use Topics   Alcohol use: No   Drug use: No     Allergies: No Known Allergies  Medications Prior to Admission  Medication Sig Dispense Refill Last Dose/Taking   acetaminophen  (TYLENOL ) 325 MG tablet Take 2 tablets (650 mg total) by mouth every 6 (six) hours as needed for moderate pain (pain score 4-6). 30 tablet 0 12/02/2023   APIXABAN (ELIQUIS) VTE STARTER PACK (10MG  AND 5MG ) Take 2 tablets (10 mg) by mouth 2 (two) times daily for 7 days, THEN take 1 tablet (5mg )  2 (two) times daily. 74 tablet 30 12/03/2023   docusate sodium (COLACE) 100 MG capsule Take 1 capsule (100 mg total) by mouth 2 (two) times daily as needed for mild constipation. 60 capsule 0 12/02/2023   hydrALAZINE (APRESOLINE) 10 MG tablet Take 1 tablet (10 mg total) by mouth every 6 (six) hours. 120 tablet 0 12/03/2023 at  8:00 AM   labetalol (NORMODYNE) 300 MG tablet Take 1 tablet (300 mg total) by mouth 2 (two) times daily. 60 tablet 1 12/03/2023 at  8:00 AM   NIFEdipine (ADALAT CC) 60 MG 24 hr tablet Take 1 tablet (60 mg total) by mouth 2 (two) times daily. 60 tablet 1 12/03/2023  at  8:00 AM   oxyCODONE  (OXY IR/ROXICODONE ) 5 MG immediate release tablet Take 1 tablet (5 mg total) by mouth every 6 (six) hours as needed for severe pain (pain score 7-10). 30 tablet 0 12/02/2023 at 11:00 PM    Review of Systems  Constitutional: Negative.   HENT: Negative.    Eyes: Negative.   Respiratory: Negative.    Cardiovascular: Negative.   Gastrointestinal:  Positive for abdominal pain.  Endocrine: Negative.   Genitourinary:  Positive for vaginal bleeding.  Musculoskeletal: Negative.   Skin:  Positive for wound (drainage from incision leaking).  Allergic/Immunologic: Negative.   Neurological: Negative.   Hematological: Negative.   Psychiatric/Behavioral: Negative.     Physical Exam   Patient Vitals for the past 24 hrs:  BP Temp Temp src Pulse Resp SpO2 Height Weight  12/03/23 1445 (!) 148/88 -- -- 78 -- -- -- --  12/03/23 1416 (!) 180/106 98.4 F (36.9 C) Oral 94 20 96 %  5' 6 (1.676 m) 125.4 kg     Physical Exam  MAU Course  Procedures  MDM Saline Lock CBC w/Diff CMP CT of abd & pelvis with contrast  Results for orders placed or performed during the hospital encounter of 12/03/23 (from the past 24 hours)  CBC with Differential/Platelet     Status: Abnormal   Collection Time: 12/03/23  3:26 PM  Result Value Ref Range   WBC 12.9 (H) 4.0 - 10.5 K/uL   RBC 3.60 (L) 3.87 - 5.11 MIL/uL   Hemoglobin 9.7 (L) 12.0 - 15.0 g/dL   HCT 68.9 (L) 63.9 - 53.9 %   MCV 86.1 80.0 - 100.0 fL   MCH 26.9 26.0 - 34.0 pg   MCHC 31.3 30.0 - 36.0 g/dL   RDW 81.8 (H) 88.4 - 84.4 %   Platelets 377 150 - 400 K/uL   nRBC 0.0 0.0 - 0.2 %   Neutrophils Relative % 75 %   Neutro Abs 9.7 (H) 1.7 - 7.7 K/uL   Lymphocytes Relative 20 %   Lymphs Abs 2.6 0.7 - 4.0 K/uL   Monocytes Relative 4 %   Monocytes Absolute 0.5 0.1 - 1.0 K/uL   Eosinophils Relative 1 %   Eosinophils Absolute 0.1 0.0 - 0.5 K/uL   Basophils Relative 0 %   Basophils Absolute 0.0 0.0 - 0.1 K/uL   Immature Granulocytes 0 %   Abs Immature Granulocytes 0.03 0.00 - 0.07 K/uL  Comprehensive metabolic panel with GFR     Status: Abnormal   Collection Time: 12/03/23  3:26 PM  Result Value Ref Range   Sodium 136 135 - 145 mmol/L   Potassium 4.7 3.5 - 5.1 mmol/L   Chloride 103 98 - 111 mmol/L   CO2 24 22 - 32 mmol/L   Glucose, Bld 86 70 - 99 mg/dL   BUN 10 6 - 20 mg/dL   Creatinine, Ser 8.68 (H) 0.44 - 1.00 mg/dL   Calcium 8.8 (L) 8.9 - 10.3 mg/dL   Total Protein 6.5 6.5 - 8.1 g/dL   Albumin 2.4 (L) 3.5 - 5.0 g/dL   AST 15 15 - 41 U/L   ALT 15 0 - 44 U/L   Alkaline Phosphatase 71 38 - 126 U/L   Total Bilirubin 0.5 0.0 - 1.2 mg/dL   GFR, Estimated 53 (L) >60 mL/min   Anion gap 9 5 - 15    Report given to and care assumed by Glenda Angles, MD @ [address] CNM 12/03/2023, 3:06  PM  Assessment and Plan  CT ABDOMEN PELVIS W CONTRAST  IMPRESSION: 1. Elongated fluid subjacent to the midline  skin staples spans 6.7 x 4.5 x 7.4 cm. There is no peripheral enhancement or internal gas, however surrounding edema in the subcutaneous fat. Sterility is indeterminate by imaging. 2. More superiorly at the level of the umbilicus is a small fluid collection measuring 4.4 x 2.6 x 5.1 cm that appears slightly more organized, tiny focus of internal gas. Sterility of this collection is also indeterminate by imaging. 3. Generalized edema of the abdominal pannus with diffuse skin thickening, can be seen with cellulitis. 4. Enlarged postpartum appearance of the uterus. Caesarean section scar may contain a small amount of fluid. Stranding adjacent to the superior uterus but no intrapelvic fluid collection.   Electronically Signed   By: Andrea Gasman M.D.  Dr. Magali discussed CT findings with Dr. Ozan. He and I removed staples and irrigated the incision. There were two openings, one near the superior end of the incision and another about 1/4 to 1/3 of the way down. Both openings probe to 10+ cm depth. Dr. Ozan came to inspect and was easily able to separate the superficial skin where we found that none of the subcutaneous layer was reapproximated. The margins are now widely separated. The tissue does not appear infected. Discussed wet-to-dry packing and wound vac options. Packed with Kerlix, and patient to consider.   Wound vac acquired and brought to MAU. Dr. Fredirick and I went to bedside to place it, however patient states that it is too large/cumbersome given that she has children to care for etc. Offered anticipatory guidance for both options as well as expected healing time. Patient still undecided. She will keep current packing and attend her incision f/u appt tomorrow morning to decide. We will keep wound vac in MAU in case she returns in the morning.   Dispo: discharge home, return precautions given, f/u already scheduled  Barabara Maier, DO FMOB Fellow, Faculty Practice [facility], Center  for [employer]

## 2023-12-04 ENCOUNTER — Other Ambulatory Visit: Payer: Self-pay

## 2023-12-04 ENCOUNTER — Ambulatory Visit (INDEPENDENT_AMBULATORY_CARE_PROVIDER_SITE_OTHER): Admitting: Family Medicine

## 2023-12-04 VITALS — BP 168/92 | HR 102 | Wt 275.1 lb

## 2023-12-04 DIAGNOSIS — T8131XA Disruption of external operation (surgical) wound, not elsewhere classified, initial encounter: Secondary | ICD-10-CM | POA: Diagnosis not present

## 2023-12-04 DIAGNOSIS — O10919 Unspecified pre-existing hypertension complicating pregnancy, unspecified trimester: Secondary | ICD-10-CM

## 2023-12-04 DIAGNOSIS — O900 Disruption of cesarean delivery wound: Secondary | ICD-10-CM

## 2023-12-04 NOTE — Assessment & Plan Note (Signed)
 Elevated blood pressure.  Initially checked and was 170/125.  Repeat improved but still elevated at 160/90s.  Patient reports that she takes her blood pressure medicine at 10:00 and has not taken her dose yet.  Recommended patient go home and take her blood pressure medications and she can have it rechecked today or tomorrow when she comes back for wound VAC placement.

## 2023-12-04 NOTE — Progress Notes (Signed)
 Pt presented to office today for post surgical wound check. Wound is currently packed from MAU. Estimated wound measurements 2 inches wide, 8 inches in length and 10+ cm in depth. [employer] rep contacted and is getting home wound vac and supplies shipped to the office. Dr. Ilean updated. Pt denies any further questions or concerns at this time.   Cyndee Molt, RN  12/04/2023 10:25

## 2023-12-04 NOTE — Progress Notes (Signed)
 Subjective:  Glenda Romero is a 38 y.o. female who presents to the clinic today for wound evaluation  HPI: Patient had a C-section on 10/15.  Was evaluated in the MAU yesterday 10/26 for oozing from the wound.  Staples were removed and wound dehisced.  There is a discussion in the MAU regarding replacement of wound VAC but patient declined because the only wound VAC available was a large wound VAC that is typically used inpatient.  She had a wet-to-dry placed and was scheduled for follow-up here today.  Reports no changes since yesterday.  Objective:  Physical Exam: BP (!) 168/92 Comment: has not taken BP medication this morning  Pulse (!) 102   Wt 275 lb 2 oz (124.8 kg)   Breastfeeding Unknown   BMI 44.41 kg/m   Gen: Alert, well-appearing, no acute distress Cardiac: Regular rate Respiratory: Normal work of breathing, speaking in full sentences Abdomen: Soft, wound appreciated Skin: Surgical wound with dehiscence, open incision, 8 cm in length by 2 cm in width packed with wet gauze   Results for orders placed or performed during the hospital encounter of 12/03/23 (from the past 72 hours)  CBC with Differential/Platelet     Status: Abnormal   Collection Time: 12/03/23  3:26 PM  Result Value Ref Range   WBC 12.9 (H) 4.0 - 10.5 K/uL   RBC 3.60 (L) 3.87 - 5.11 MIL/uL   Hemoglobin 9.7 (L) 12.0 - 15.0 g/dL   HCT 68.9 (L) 63.9 - 53.9 %   MCV 86.1 80.0 - 100.0 fL   MCH 26.9 26.0 - 34.0 pg   MCHC 31.3 30.0 - 36.0 g/dL   RDW 81.8 (H) 88.4 - 84.4 %   Platelets 377 150 - 400 K/uL   nRBC 0.0 0.0 - 0.2 %   Neutrophils Relative % 75 %   Neutro Abs 9.7 (H) 1.7 - 7.7 K/uL   Lymphocytes Relative 20 %   Lymphs Abs 2.6 0.7 - 4.0 K/uL   Monocytes Relative 4 %   Monocytes Absolute 0.5 0.1 - 1.0 K/uL   Eosinophils Relative 1 %   Eosinophils Absolute 0.1 0.0 - 0.5 K/uL   Basophils Relative 0 %   Basophils Absolute 0.0 0.0 - 0.1 K/uL   Immature Granulocytes 0 %   Abs Immature Granulocytes  0.03 0.00 - 0.07 K/uL    Comment: Performed at [facility], [address], [place], [place] [address]  Comprehensive metabolic panel with GFR     Status: Abnormal   Collection Time: 12/03/23  3:26 PM  Result Value Ref Range   Sodium 136 135 - 145 mmol/L   Potassium 4.7 3.5 - 5.1 mmol/L   Chloride 103 98 - 111 mmol/L   CO2 24 22 - 32 mmol/L   Glucose, Bld 86 70 - 99 mg/dL    Comment: Glucose reference range applies only to samples taken after fasting for at least 8 hours.   BUN 10 6 - 20 mg/dL   Creatinine, Ser 8.68 (H) 0.44 - 1.00 mg/dL   Calcium 8.8 (L) 8.9 - 10.3 mg/dL   Total Protein 6.5 6.5 - 8.1 g/dL   Albumin 2.4 (L) 3.5 - 5.0 g/dL   AST 15 15 - 41 U/L   ALT 15 0 - 44 U/L   Alkaline Phosphatase 71 38 - 126 U/L   Total Bilirubin 0.5 0.0 - 1.2 mg/dL   GFR, Estimated 53 (L) >60 mL/min    Comment: (NOTE) Calculated using the CKD-EPI  Creatinine Equation (2021)    Anion gap 9 5 - 15    Comment: Performed at [facility], [address], [place], [place] [address]     Assessment/Plan:  Wound dehiscence, cesarean Wound dehiscence after vertical skin incision for cesarean section.  Initial plan was to place wound VAC but patient declined due to the large canister associated with the wound VAC.  Spoke with wound doctor recommended wet-to-dry and eventual wound VAC placement.  Patient currently with wet-to-dry but reports she will not be changing it twice daily because of the pain.  Spoke with case management and wound VAC supply team who will be able to come to clinic either today or tomorrow to place wound VAC.  Supplies will be brought.  They will also schedule her for wound VAC care and she will need twice weekly changes.  Patient will return later today for placement.  No further questions or concerns.  Chronic hypertension affecting pregnancy Elevated blood pressure.  Initially checked and was 170/125.  Repeat improved but still elevated at 160/90s.  Patient reports  that she takes her blood pressure medicine at 10:00 and has not taken her dose yet.  Recommended patient go home and take her blood pressure medications and she can have it rechecked today or tomorrow when she comes back for wound VAC placement.   Lab Orders  No laboratory test(s) ordered today    No orders of the defined types were placed in this encounter.     Steffan Rover, MD Attending Family Medicine Physician, [facility] for [facility], [facility]   12/04/23 10:33 AM

## 2023-12-04 NOTE — Progress Notes (Signed)
 12/04/23 Addendum for nurse visit 11/30/23: wound vac removal assisted by Diane Day,RN. Rock Skip PEAK

## 2023-12-04 NOTE — Assessment & Plan Note (Signed)
 Wound dehiscence after vertical skin incision for cesarean section.  Initial plan was to place wound VAC but patient declined due to the large canister associated with the wound VAC.  Spoke with wound doctor recommended wet-to-dry and eventual wound VAC placement.  Patient currently with wet-to-dry but reports she will not be changing it twice daily because of the pain.  Spoke with case management and wound VAC supply team who will be able to come to clinic either today or tomorrow to place wound VAC.  Supplies will be brought.  They will also schedule her for wound VAC care and she will need twice weekly changes.  Patient will return later today for placement.  No further questions or concerns.

## 2023-12-05 ENCOUNTER — Ambulatory Visit (INDEPENDENT_AMBULATORY_CARE_PROVIDER_SITE_OTHER): Admitting: Family Medicine

## 2023-12-05 VITALS — BP 171/100 | HR 77 | Ht 66.0 in | Wt 276.5 lb

## 2023-12-05 DIAGNOSIS — O900 Disruption of cesarean delivery wound: Secondary | ICD-10-CM | POA: Diagnosis not present

## 2023-12-05 MED ORDER — OXYCODONE HCL 5 MG PO TABS: 5.0000 mg | ORAL_TABLET | Freq: Four times a day (QID) | ORAL | 0 refills | Status: DC | PRN

## 2023-12-05 MED ORDER — AMOXICILLIN-POT CLAVULANATE 875-125 MG PO TABS: 1.0000 | ORAL_TABLET | Freq: Two times a day (BID) | ORAL | 0 refills | Status: AC

## 2023-12-05 NOTE — Addendum Note (Signed)
 Addended by: FREDIRICK GLENYS RAMAN on: 12/05/2023 05:44 PM   Modules accepted: Level of Service

## 2023-12-05 NOTE — Progress Notes (Addendum)
 Patient seen and examined with team. Marked yellow exudate noted.  Wound vac placed. Rx for Augmentin sent in. Rx for pain meds sent in.  BP is markedly elevated. She did not take her meds.  Advised of dangers of not taking her meds. To f/u on Friday for dressing change.

## 2023-12-05 NOTE — Progress Notes (Signed)
 Ian is here for a Wound Vac. Randine OTA representative present. The patient had a classical C/S 11/22/23. She was seen at [facility] 12/03/23 for wound dehiscence. The wound was previously packed with kerlix; wet to dry dressing. After removal of the kerlix dressing the wound measurements were:  Width- 6cm 21cm length x 6cm width x 15 cm deep  Stoma adhesive (hydrocolloid) 2 pieces used to pack incision. Patient BP 171/100; patient states she didn't take medication this morning. Reviewed chart with provider.  Patient advised to take blood pressure medications as prescribed. Patient verbalized understanding.  Devon, RN

## 2023-12-08 ENCOUNTER — Other Ambulatory Visit (HOSPITAL_COMMUNITY)
Admission: RE | Admit: 2023-12-08 | Discharge: 2023-12-08 | Disposition: A | Source: Ambulatory Visit | Attending: Family Medicine | Admitting: Family Medicine

## 2023-12-08 ENCOUNTER — Ambulatory Visit: Admitting: *Deleted

## 2023-12-08 ENCOUNTER — Other Ambulatory Visit: Payer: Self-pay

## 2023-12-08 VITALS — BP 160/101 | HR 79 | Temp 98.5°F | Ht 66.0 in | Wt 277.8 lb

## 2023-12-08 DIAGNOSIS — Z4889 Encounter for other specified surgical aftercare: Secondary | ICD-10-CM | POA: Insufficient documentation

## 2023-12-08 DIAGNOSIS — L089 Local infection of the skin and subcutaneous tissue, unspecified: Secondary | ICD-10-CM | POA: Diagnosis not present

## 2023-12-08 DIAGNOSIS — G8918 Other acute postprocedural pain: Secondary | ICD-10-CM

## 2023-12-08 MED ORDER — IBUPROFEN 800 MG PO TABS
800.0000 mg | ORAL_TABLET | Freq: Once | ORAL | Status: AC
  Administered 2023-12-08: 800 mg via ORAL

## 2023-12-08 NOTE — Progress Notes (Addendum)
 Here for wound vac removal and new one applied. She reports her wound is draining and she changed the canister once. BP elevated at 160/101. She reports she took all 3 meds this morning about 10am.  Company representative , Cyndee Molt, RN, Devon Geoffrey OBIE Rosaline Wenceslao OBIE  in to assist with wound vac removal and application of new wound vac. Dr. Cresenzo and Dr. Jeralyn in to assess and debride wound. Wound measurements: depth: 15 cm; length: 20 cm: width 4 cm.  Wound vac removed; skin cleansed with soap and water. 2 sponges removed.  Dr. Jeralyn debrided wound. New sponges x 2 apllied and new wound vac dressing and tubing applied. Motrin  800 mg po given x1 for c/o pain. She did not take any pain med this am because she had to drive. Advised to take medicaid transportation if desired so she can medicate before next visit on Tuesday or at least take Ibuprofen  before she comes. Noted wound vac battery had depleted x 10 hours ; she reports her power went out. Advised to keep battery charged at all times. She voices understanding. Addendum added 12/11/23 8:47 am.  Wound with foul odor , further wound description per Dr. Jeralyn.  Rock Skip OBIE

## 2023-12-08 NOTE — Addendum Note (Signed)
 Addended by: Ronelle Michie L on: 12/08/2023 12:25 PM   Modules accepted: Orders

## 2023-12-08 NOTE — Addendum Note (Signed)
 Addended by: Jordane Hisle O on: 12/08/2023 12:40 PM   Modules accepted: Orders

## 2023-12-12 ENCOUNTER — Ambulatory Visit: Admitting: Obstetrics and Gynecology

## 2023-12-12 ENCOUNTER — Encounter: Payer: Self-pay | Admitting: Obstetrics and Gynecology

## 2023-12-12 ENCOUNTER — Other Ambulatory Visit: Payer: Self-pay

## 2023-12-12 ENCOUNTER — Ambulatory Visit

## 2023-12-12 ENCOUNTER — Ambulatory Visit: Payer: Self-pay | Admitting: Obstetrics and Gynecology

## 2023-12-12 VITALS — BP 139/89 | HR 87

## 2023-12-12 DIAGNOSIS — O900 Disruption of cesarean delivery wound: Secondary | ICD-10-CM

## 2023-12-12 LAB — SURGICAL PATHOLOGY

## 2023-12-12 NOTE — Progress Notes (Signed)
  CC: wound cleaning Subjective:    Patient ID: Glenda Romero, female    DOB: May 04, 1985, 38 y.o.   MRN: [id number]  HPI Pt seen for wound vac exchange and wound cleaning.  [facility] nurses present and wound vac nurse.  Old wound vac removed.  Foul smell noted but no obvious signs of infection.  Patches of thick exudative material noted.  Wound measured and was 20 x 5.5 x 10 cm which is slight improvement from previous.  Exudate removed with sharp debridement.  Wound rinsed again and new wound vac placed in sterile fashion.   Review of Systems     Objective:   Physical Exam Vitals:   12/12/23 1602  BP: 139/89  Pulse: 87         Assessment & Plan:   1. Wound dehiscence, cesarean, postpartum condition (Primary) Continue oral abx Continue wound vacuum per protocol Next exchange on  12/15/23    Jerilynn DELENA Buddle, MD Faculty Attending, Center for [facility]

## 2023-12-14 NOTE — Addendum Note (Signed)
 Addended by: Alver Leete O on: 12/14/2023 11:41 AM   Modules accepted: Level of Service

## 2023-12-15 ENCOUNTER — Ambulatory Visit (INDEPENDENT_AMBULATORY_CARE_PROVIDER_SITE_OTHER): Admitting: Obstetrics and Gynecology

## 2023-12-15 ENCOUNTER — Encounter: Payer: Self-pay | Admitting: Obstetrics and Gynecology

## 2023-12-15 ENCOUNTER — Other Ambulatory Visit: Payer: Self-pay

## 2023-12-15 DIAGNOSIS — O900 Disruption of cesarean delivery wound: Secondary | ICD-10-CM | POA: Diagnosis not present

## 2023-12-15 DIAGNOSIS — G8918 Other acute postprocedural pain: Secondary | ICD-10-CM

## 2023-12-15 DIAGNOSIS — Z4889 Encounter for other specified surgical aftercare: Secondary | ICD-10-CM

## 2023-12-15 MED ORDER — OXYCODONE HCL 5 MG PO TABS: 5.0000 mg | ORAL_TABLET | Freq: Four times a day (QID) | ORAL | 0 refills | Status: DC | PRN

## 2023-12-15 NOTE — Progress Notes (Signed)
    GYNECOLOGY VISIT  Patient name: Glenda Romero MRN [id number]  Date of birth: 09-02-1985 Chief Complaint:   wound vac removal and new application  History:  In for wound assessment with [facility] nursing and wound nurse. After measurement of the wound the incision was inspected. Incision cleaned with saline suspected necrotic tissue removed with scissors to expose healthy tissue throughout the incision - as much as could be tolerated was removed. The incision was then irrigated with 1% lidocaine . See pictures in chart. Reviewed plan with patient and emphasized the importance of monitoring output and if there is a recurrence of a foul smell, immediate attention needed to assess wound and seal of wound vac. Cultures obtained and tissue sent to pathology.      The following portions of the patient's history were reviewed and updated as appropriate: allergies, current medications, past family history, past medical history, past social history, past surgical history and problem list.     Objective:  Physical Exam BP (!) 160/101   Pulse 79   Temp 98.5 F (36.9 C)   Ht 5' 6 (1.676 m)   Wt 277 lb 12.8 oz (126 kg)   BMI 44.84 kg/m    Physical Exam Abdominal:     Comments: Large lower midline incision Cleaned with saline Yellow, necrotic areas sharply removed with scissors, wound culture collected and then rinsed with lidocaine  prior to RN team replacing vacuum         Assessment & Plan:   1. Encounter for post surgical wound check (Primary) Culture collected, wound debrided and vacuum replaced. Showed wound to patient an emphasized importance of care at home. Discussed will likely take several weeks to heal and that healing would be by secondary intention. All questions answered - Anaerobic and Aerobic Culture - Surgical pathology( [place]/ [facility])  2. Pain following surgery or procedure  - ibuprofen  (ADVIL ) tablet 800 mg   Henritta Mutz, MD Minimally Invasive  Gynecologic Surgery Center for [facility], [facility]

## 2023-12-15 NOTE — Progress Notes (Signed)
  CC: wound vac exchange Subjective:    Patient ID: Glenda Romero, female    DOB: [birth year], 38 y.o.   MRN: [id number]  HPI Pt seen for wound vac exchange and wound cleaning.[facility] nurses present.  Old wound vac removed.  Odor significantly decreased this visit. No eschar noted this visit.  Beefy red granulation tissue noted.  Today wound measured 7 x 16 x 6.5 cm, significant improvement from previous.  Wound rinsed and new wound vac placed in sterile fashion.   Review of Systems     Objective:   Physical Exam There were no vitals filed for this visit.       Assessment & Plan:   1. Wound dehiscence, cesarean, postpartum condition (Primary) Last day of antibiotics today Wound looks improved. Next exchange is 12/19/23 Continue wound vac per protocol. Referral to wound care clinic sent Work note placed in [place].  - oxyCODONE  (OXY IR/ROXICODONE ) 5 MG immediate release tablet; Take 1 tablet (5 mg total) by mouth every 6 (six) hours as needed for severe pain (pain score 7-10).  Dispense: 30 tablet; Refill: 0 - AMB referral to wound care center  2. Encounter for post surgical wound check  - oxyCODONE  (OXY IR/ROXICODONE ) 5 MG immediate release tablet; Take 1 tablet (5 mg total) by mouth every 6 (six) hours as needed for severe pain (pain score 7-10).  Dispense: 30 tablet; Refill: 0 - AMB referral to wound care center  3. Pain following surgery or procedure  - oxyCODONE  (OXY IR/ROXICODONE ) 5 MG immediate release tablet; Take 1 tablet (5 mg total) by mouth every 6 (six) hours as needed for severe pain (pain score 7-10).  Dispense: 30 tablet; Refill: 0    Jerilynn DELENA Buddle, MD Faculty Attending, Center for [facility]

## 2023-12-18 LAB — ANAEROBIC AND AEROBIC CULTURE

## 2023-12-18 NOTE — Addendum Note (Signed)
 Addended by: Annalis Kaczmarczyk O on: 12/18/2023 12:57 PM   Modules accepted: Level of Service

## 2023-12-19 ENCOUNTER — Other Ambulatory Visit: Payer: Self-pay

## 2023-12-19 ENCOUNTER — Ambulatory Visit (INDEPENDENT_AMBULATORY_CARE_PROVIDER_SITE_OTHER): Admitting: Obstetrics and Gynecology

## 2023-12-19 DIAGNOSIS — Z4889 Encounter for other specified surgical aftercare: Secondary | ICD-10-CM

## 2023-12-19 DIAGNOSIS — Z6841 Body Mass Index (BMI) 40.0 and over, adult: Secondary | ICD-10-CM

## 2023-12-19 DIAGNOSIS — O900 Disruption of cesarean delivery wound: Secondary | ICD-10-CM

## 2023-12-19 DIAGNOSIS — I82532 Chronic embolism and thrombosis of left popliteal vein: Secondary | ICD-10-CM

## 2023-12-19 DIAGNOSIS — L089 Local infection of the skin and subcutaneous tissue, unspecified: Secondary | ICD-10-CM

## 2023-12-19 DIAGNOSIS — G8918 Other acute postprocedural pain: Secondary | ICD-10-CM

## 2023-12-19 MED ORDER — OXYCODONE HCL 5 MG PO TABS: 5.0000 mg | ORAL_TABLET | Freq: Four times a day (QID) | ORAL | 0 refills | Status: DC | PRN

## 2023-12-19 MED ORDER — CIPROFLOXACIN HCL 500 MG PO TABS: 500.0000 mg | ORAL_TABLET | Freq: Two times a day (BID) | ORAL | 0 refills | Status: AC

## 2023-12-19 NOTE — Progress Notes (Signed)
 GYNECOLOGY OFFICE NOTE  History:  38 y.o. [id number] here today for wound vac change for wound dehiscence after c-section. Has been doing twice weekly wound vac changes.    Past Medical History:  Diagnosis Date   Gestational hypertension    Obesity     Past Surgical History:  Procedure Laterality Date   CESAREAN SECTION MULTI-GESTATIONAL N/A 11/22/2023   Procedure: CESAREAN SECTION, MULTIPLE FETUSES;  Surgeon: Izell Harari, MD;  Location: [facility] LD ORS;  Service: Obstetrics;  Laterality: N/A;   TONSILLECTOMY       Current Outpatient Medications:    acetaminophen  (TYLENOL ) 325 MG tablet, Take 2 tablets (650 mg total) by mouth every 6 (six) hours as needed for moderate pain (pain score 4-6)., Disp: 30 tablet, Rfl: 0   APIXABAN (ELIQUIS) VTE STARTER PACK (10MG  AND 5MG ), Take 2 tablets (10 mg) by mouth 2 (two) times daily for 7 days, THEN take 1 tablet (5mg )  2 (two) times daily., Disp: 74 tablet, Rfl: 30   ciprofloxacin (CIPRO) 500 MG tablet, Take 1 tablet (500 mg total) by mouth 2 (two) times daily., Disp: 28 tablet, Rfl: 0   hydrALAZINE (APRESOLINE) 10 MG tablet, Take 1 tablet (10 mg total) by mouth every 6 (six) hours., Disp: 120 tablet, Rfl: 0   labetalol (NORMODYNE) 300 MG tablet, Take 1 tablet (300 mg total) by mouth 2 (two) times daily., Disp: 60 tablet, Rfl: 1   NIFEdipine (ADALAT CC) 60 MG 24 hr tablet, Take 1 tablet (60 mg total) by mouth 2 (two) times daily., Disp: 60 tablet, Rfl: 1   oxyCODONE  (OXY IR/ROXICODONE ) 5 MG immediate release tablet, Take 1 tablet (5 mg total) by mouth every 6 (six) hours as needed for severe pain (pain score 7-10)., Disp: 30 tablet, Rfl: 0  The following portions of the patient's history were reviewed and updated as appropriate: allergies, current medications, past family history, past medical history, past social history, past surgical history and problem list.   Review of Systems:  Pertinent items noted in HPI and remainder of comprehensive ROS  otherwise negative.   Objective:  Physical Exam There were no vitals taken for this visit. CONSTITUTIONAL: Well-developed, well-nourished female in no acute distress.  HENT:  Normocephalic, atraumatic. External right and left ear normal. Oropharynx is clear and moist EYES: Conjunctivae and EOM are normal. Pupils are equal, round, and reactive to light. No scleral icterus.  NECK: Normal range of motion, supple, no masses SKIN: Skin is warm and dry. No rash noted. Not diaphoretic. No erythema. No pallor. NEUROLOGIC: Alert and oriented to person, place, and time. Normal reflexes, muscle tone coordination. No cranial nerve deficit noted. PSYCHIATRIC: Normal mood and affect. Normal behavior. Normal judgment and thought content. CARDIOVASCULAR: Normal heart rate noted RESPIRATORY: Effort normal, no problems with respiration noted ABDOMEN: Soft, large midline vertical incision wound present with significant odor, tape and foam for wound vac removed, tissue underneath bright red with tiny dots blood, no areas with significant bleeding, all tissue appears well perfused and healthy, wound measured at 16 cm long (vertical) by 7 cm deep by 5.5 cm wide, irrigated wound and placed new foam piece, attached to vacuum with good suction effect PELVIC: deferred MUSCULOSKELETAL: Normal range of motion. No edema noted.  Exam done with chaperone present.  Labs and Imaging   Assessment & Plan:  1. Chronic deep vein thrombosis (DVT) of left popliteal vein (HCC) (Primary) Cont lovenox  2. Wound dehiscence, cesarean Sent cipro 500 mg BID today to cover wound  infection - newly swabbed - Anaerobic and Aerobic Culture - pt understandably frustrated with healing process, pain associated with wound vac changes, being unable to work with wound vac, consents to placement of new wound vac - sent rx for oxycodone  for wound changes prn - called wound care office to see if they can get her in sooner - refer to  pregnancy navigator given the difficulty of her circumstances (extensive wound requiring significant time for changes, pain, newborn remaining in NICU, being unable to work)  3. BMI 50.0-59.9, adult (HCC)  4. Wound dehiscence, cesarean, postpartum condition - oxyCODONE  (OXY IR/ROXICODONE ) 5 MG immediate release tablet; Take 1 tablet (5 mg total) by mouth every 6 (six) hours as needed for severe pain (pain score 7-10).  Dispense: 30 tablet; Refill: 0  5. Encounter for post surgical wound check - oxyCODONE  (OXY IR/ROXICODONE ) 5 MG immediate release tablet; Take 1 tablet (5 mg total) by mouth every 6 (six) hours as needed for severe pain (pain score 7-10).  Dispense: 30 tablet; Refill: 0  6. Pain following surgery or procedure - oxyCODONE  (OXY IR/ROXICODONE ) 5 MG immediate release tablet; Take 1 tablet (5 mg total) by mouth every 6 (six) hours as needed for severe pain (pain score 7-10).  Dispense: 30 tablet; Refill: 0  7. Wound infection   Routine preventative health maintenance measures emphasized. Please refer to After Visit Summary for other counseling recommendations.   Return in about 3 days (around 12/22/2023).   Glenda Yolanda Moats, MD, [facility] Attending Center for [facility])

## 2023-12-19 NOTE — Progress Notes (Signed)
 Pt declined blood pressure check. Patient stated that we have not been checking her blood pressure at previous visits and reports that she did not take her blood pressure medicine today. Provider aware.   B'Aisha, RMA

## 2023-12-21 DIAGNOSIS — T8131XA Disruption of external operation (surgical) wound, not elsewhere classified, initial encounter: Secondary | ICD-10-CM | POA: Diagnosis not present

## 2023-12-22 ENCOUNTER — Ambulatory Visit: Admitting: Obstetrics and Gynecology

## 2023-12-22 ENCOUNTER — Other Ambulatory Visit: Payer: Self-pay

## 2023-12-22 VITALS — BP 168/118 | HR 96 | Wt 276.0 lb

## 2023-12-22 DIAGNOSIS — L089 Local infection of the skin and subcutaneous tissue, unspecified: Secondary | ICD-10-CM

## 2023-12-22 DIAGNOSIS — I82532 Chronic embolism and thrombosis of left popliteal vein: Secondary | ICD-10-CM

## 2023-12-22 DIAGNOSIS — O900 Disruption of cesarean delivery wound: Secondary | ICD-10-CM

## 2023-12-22 DIAGNOSIS — Z4889 Encounter for other specified surgical aftercare: Secondary | ICD-10-CM

## 2023-12-22 DIAGNOSIS — T148XXA Other injury of unspecified body region, initial encounter: Secondary | ICD-10-CM

## 2023-12-22 NOTE — Patient Instructions (Addendum)
 Wet to dry dressing change at least twice daily until you are seen in the wound clinic. If there are concerns over the weekend, present to the MAU to have the wound evaluated. Referral placed to the [facility] wound clinic - if you are not able to be seen in their clinic or they recommend a wound vac, that can be replaced her.   Put on the new dressing: ?Put on clean gloves. ?Follow your doctor's instructions for covering or packing the wound. This might also include putting medicine either directly on the wound or on the wound edges. ?If the wound needs to be packed, your doctor or nurse will show you how. It involves putting gauze or other special material into the wound. This packing material helps absorb drainage if the wound is wet. It can also give moisture if the wound is dried out. It helps keep the wound clean as it heals. ?Cover the new dressing with a dry bandage, if needed, and secure with tape. ?Take off the gloves, and throw them away. Wash your hands with soap and water.

## 2023-12-22 NOTE — Progress Notes (Signed)
 GYNECOLOGY VISIT  Patient name: Glenda Romero MRN [id number]  Date of birth: 08-05-1985 Chief Complaint:   Dressing Change  History:  Glenda Romero here for wound vac change/removal - requesting a referral to [facility] clinic specialist on [address] can't see her with the wound vac in place but if wound vac removed they can review referral and then help wthe we tot dry dressing changes. She is not sure how often she would be able to go there and see it and not sure how she would manage the wound at home. Works at [employer]. Reports that despite letter provided to occupation was told could not work with vacuum and therefore needs vacuum removed.      The following portions of the patient's history were reviewed and updated as appropriate: allergies, current medications, past family history, past medical history, past social history, past surgical history and problem list.   Health Maintenance:   Last pap No results found for: DIAGPAP, HPVHIGH, ADEQPAP  Health Maintenance  Topic Date Due   Hepatitis C Screening  Never done   DTaP/Tdap/Td vaccine (1 - Tdap) Never done   Hepatitis B Vaccine (1 of 3 - 19+ 3-dose series) Never done   HPV Vaccine (1 - 3-dose SCDM series) Never done   Pap with HPV screening  Never done   Flu Shot  Never done   COVID-19 Vaccine (1 - 2025-26 season) Never done   HIV Screening  Completed   Pneumococcal Vaccine  Aged Out   Meningitis B Vaccine  Aged Out      Review of Systems:  Pertinent items are noted in HPI. Comprehensive review of systems was otherwise negative.   Objective:  Physical Exam BP (!) 168/118   Pulse 96   Wt 276 lb (125.2 kg)   BMI 44.55 kg/m    Physical Exam Vitals and nursing note reviewed.  Constitutional:      Appearance: Normal appearance.  HENT:     Head: Normocephalic and atraumatic.  Pulmonary:     Effort: Pulmonary effort is normal.  Abdominal:     Comments: Soft abdomen   Wound vac removed and surrounding skin non-tender without erythema Infraumbilical midline wound with well perfused granulation tissue measuring 6cm deep, 15cm long, 5cm wide. Very small areas of exudate in lower apex of incision, removed sharply. Wound irrigated with antimicrobial wash, sprayed with lidocaine , packed with saline guaze and covered with ABD dressing  Skin:    General: Skin is warm and dry.  Neurological:     General: No focal deficit present.     Mental Status: She is alert.  Psychiatric:        Mood and Affect: Mood normal.        Behavior: Behavior normal.        Thought Content: Thought content normal.        Judgment: Judgment normal.       Assessment & Plan:  1. Encounter for post surgical wound check (Primary) 2. Wound dehiscence, cesarean In depth discussion regarding wound care and best practice to hasten wound care. Empathized with patient regarding missed work and income due to vacuum being in place. Offered looking into additional resources/forms that would help/justify use of DME (wound vac) in the work place. Reviewed wet to dry would need to be at least twice daily and likely slow down the significant progress that had been made thus far. Patient elects to proceed with keeping wound  vac off and having wet to dry dressing. Referral to wound clinic place - Ambulatory referral to Wound Clinic  3. 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 Chronic deep vein thrombosis (DVT) of left popliteal vein (HCC) On apixiban for management    Carter Quarry, MD Minimally Invasive Gynecologic Surgery Center for [facility], [facility]

## 2023-12-25 ENCOUNTER — Other Ambulatory Visit: Payer: Self-pay

## 2023-12-25 ENCOUNTER — Other Ambulatory Visit: Payer: Self-pay | Admitting: Family Medicine

## 2023-12-25 ENCOUNTER — Telehealth: Payer: Self-pay

## 2023-12-25 DIAGNOSIS — G8918 Other acute postprocedural pain: Secondary | ICD-10-CM

## 2023-12-25 DIAGNOSIS — Z4889 Encounter for other specified surgical aftercare: Secondary | ICD-10-CM

## 2023-12-25 DIAGNOSIS — O900 Disruption of cesarean delivery wound: Secondary | ICD-10-CM

## 2023-12-25 MED ORDER — OXYCODONE HCL 5 MG PO TABS
5.0000 mg | ORAL_TABLET | Freq: Four times a day (QID) | ORAL | 0 refills | Status: AC | PRN
Start: 2023-12-25 — End: ?

## 2023-12-25 NOTE — Telephone Encounter (Signed)
 Referral information faxed over [facility] per pt request.   Cyndee Molt, RN  12/25/2023

## 2023-12-26 ENCOUNTER — Ambulatory Visit: Admitting: Obstetrics and Gynecology

## 2023-12-26 LAB — ANAEROBIC AND AEROBIC CULTURE

## 2023-12-27 ENCOUNTER — Ambulatory Visit: Payer: Self-pay | Admitting: Obstetrics and Gynecology

## 2023-12-29 ENCOUNTER — Other Ambulatory Visit: Payer: Self-pay

## 2023-12-29 ENCOUNTER — Telehealth: Payer: Self-pay

## 2023-12-29 ENCOUNTER — Ambulatory Visit: Admitting: Obstetrics and Gynecology

## 2023-12-29 VITALS — BP 155/103 | HR 66

## 2023-12-29 DIAGNOSIS — Z5189 Encounter for other specified aftercare: Secondary | ICD-10-CM | POA: Diagnosis not present

## 2023-12-29 DIAGNOSIS — I10 Essential (primary) hypertension: Secondary | ICD-10-CM

## 2023-12-29 MED ORDER — BLOOD PRESSURE KIT DEVI: 1.0000 | Freq: Once | 0 refills | Status: AC

## 2023-12-29 NOTE — Progress Notes (Signed)
 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 Patient requests letter for work that doesn't list restrictions. She reports changing dressings at home twice a day with wound flushes of saline after dressing removals. Takes Hydralazine , Labetalol , Nifedipine  for BP. Did not take BP meds this morning as she didn't time have to eat. Discussed leaving snacks in car with water  she she can take her medicines. BP cuff ordered to pharmacy. She has an appt with wound clinic at [facility] in [facility] December 1. 4 pieces of saturated gauze removed. Vashe wound solution 100cc flushed over wound. Wound measures 15cm length x 5.5cm wide x 7cm deep. Dr Jeralyn in to assess patient.   Elenor DEL RN BSN 12/29/23

## 2023-12-29 NOTE — Progress Notes (Signed)
 GYNECOLOGY VISIT  Patient name: Glenda Romero MRN [id number]  Date of birth: Oct 21, 1985 Chief Complaint:   Wound Check  History:  [facility] here for wound check. Reports has been changing the packing at home twice daily, using gloves and new supplies with each change. Has noticed the odor from incision.  Has returned to work but states that initial letter is pending approval and requests another letter that states can return to work without reference to restrictions. Patient reports that while prior letter is being processed, a new letter would likely be approved. She expresses reasonable frustration that her job is not allowing her to work with restrictions - has already stopped using wound vacuum due to being told she can't use it while at work and now may lose income over the next 2 weeks while the prior letter is being Romero therapist.   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 Stated there were too many restrictions in original letter. Per Tam the letter needs to state she doesn't have restrictions and that will allow her to put [facility] back on the schedule.  States company doesn't allow restrictions - reports her and the employee have the understanding and has been supporting her by ensuring she is is not overworking herself, sitting down, and getting breaks as needed. Understands that corporate likely to absolve themselves of liability in case there is an injury at work due to her returning with restrictions and  legal may be involved.  Cecily states not aware of what disability forms/exemptions may be allowed. States corporate doesn't want to be held liable if there are issues wile at work nor allow her to work with multiple restrictions.  Delphina financial contacted - the prior letter provided is why she was not cleared to return to work. Until clearance obtained, Quina has been removed from the schedule.  Did not take blood pressure medicine this morning due to  running late and has to take all medications with food. Does not have BP cuff at home to check blood pressure.   Scheduled at [facility] wound clinic 12/1.   The following portions of the patient's history were reviewed and updated as appropriate: allergies, current medications, past family history, past medical history, past social history, past surgical history and problem list.   Health Maintenance:   Last pap No results found for: DIAGPAP, HPVHIGH, ADEQPAP  Health Maintenance  Topic Date Due   Hepatitis C Screening  Never done   DTaP/Tdap/Td vaccine (1 - Tdap) Never done   Hepatitis B Vaccine (1 of 3 - 19+ 3-dose series) Never done   HPV Vaccine (1 - 3-dose SCDM series) Never done   Pap with HPV screening  Never done   Flu Shot  Never done   COVID-19 Vaccine (1 - 2025-26 season) Never done   HIV Screening  Completed   Pneumococcal Vaccine  Aged Out   Meningitis B Vaccine  Aged Out    Review of Systems:  Pertinent items are noted in HPI. Comprehensive review of systems was otherwise negative.   Objective:  Physical Exam BP (!) 155/103   Pulse 66   Breastfeeding No    Physical Exam Vitals and nursing note reviewed.  Constitutional:      Appearance: Normal appearance.  HENT:     Head: Normocephalic and atraumatic.  Pulmonary:     Effort: Pulmonary effort is normal.  Abdominal:     Comments: Infraumbilical midline wound -  15cm length x 5.5cm  wide x 7cm deep Exudate noted and collected for culture, odor     Skin:    General: Skin is warm and dry.  Neurological:     General: No focal deficit present.     Mental Status: She is alert.  Psychiatric:        Mood and Affect: Mood normal.        Behavior: Behavior normal.        Thought Content: Thought content normal.        Judgment: Judgment normal.     Media Information   Document Information       Assessment & Plan:   1. Visit for wound check (Primary) Incision cleaned with Vash antimicrobial wash,  culture collected given worsened odor since prior evaluation. Wound debrided with gauze and overlying exudate removed prior to re-dressing wound. Will complete current antibiotic course. I voiced my concern regarding the letter and wound care and noted that putting 'no restrictions' did not seem appropriate. Patient voiced understanding and notes that letter simply needs to state a return to work date. Will touch base with her boss when meeting completed, awaiting call back. Recommend continued wound care and reiterated that healing with wet to dry will be slower than vacuum. Has appointment with [facility] wound clinic already scheduled.  -Letter provided in chart stating may return to work. Needs to continue wound care. If culture returns with additional bacteria, will treat accordingly.   - Anaerobic and Aerobic Culture  2. Chronic hypertension Recommend checking BP at home. Reviewed risks associated with chronic severe hypertension.  - Blood Pressure Monitoring (BLOOD PRESSURE KIT) DEVI; 1 Device by Does not apply route once for 1 dose. To check blood pressure at home daily. Chronic hypertension icd 10 dx:O10.919. needs large size  Dispense: 1 each; Refill: 0    Carter Quarry, MD Minimally Invasive Gynecologic Surgery Center for [facility], [facility]

## 2023-12-29 NOTE — Telephone Encounter (Signed)
 Levorn from [facility] called to obtain measurements of wound from 12/19/23 per insurance requirement.      Waddell, RN

## 2024-01-01 DIAGNOSIS — Z0289 Encounter for other administrative examinations: Secondary | ICD-10-CM

## 2024-01-02 ENCOUNTER — Ambulatory Visit

## 2024-01-04 LAB — ANAEROBIC AND AEROBIC CULTURE

## 2024-01-08 ENCOUNTER — Ambulatory Visit: Payer: Self-pay | Admitting: Obstetrics and Gynecology

## 2024-01-22 ENCOUNTER — Other Ambulatory Visit (HOSPITAL_COMMUNITY): Payer: Self-pay
